# Patient Record
Sex: Male | Born: 1935 | Race: White | Hispanic: No | Smoking: Former smoker
Health system: Southern US, Community
[De-identification: ages and names within clinical notes are randomized; demographics above are authoritative.]

## PROBLEM LIST (undated history)

## (undated) DIAGNOSIS — K219 Gastro-esophageal reflux disease without esophagitis: Secondary | ICD-10-CM

## (undated) DIAGNOSIS — N400 Enlarged prostate without lower urinary tract symptoms: Secondary | ICD-10-CM

## (undated) DIAGNOSIS — M109 Gout, unspecified: Secondary | ICD-10-CM

## (undated) DIAGNOSIS — M519 Unspecified thoracic, thoracolumbar and lumbosacral intervertebral disc disorder: Secondary | ICD-10-CM

## (undated) DIAGNOSIS — I10 Essential (primary) hypertension: Secondary | ICD-10-CM

## (undated) DIAGNOSIS — R195 Other fecal abnormalities: Secondary | ICD-10-CM

## (undated) DIAGNOSIS — E785 Hyperlipidemia, unspecified: Secondary | ICD-10-CM

## (undated) DIAGNOSIS — M1990 Unspecified osteoarthritis, unspecified site: Secondary | ICD-10-CM

## (undated) HISTORY — PX: EYE SURGERY: SHX253

## (undated) HISTORY — DX: Other fecal abnormalities: R19.5

## (undated) HISTORY — DX: Benign prostatic hyperplasia without lower urinary tract symptoms: N40.0

## (undated) HISTORY — DX: Gout, unspecified: M10.9

## (undated) HISTORY — PX: COLONOSCOPY: SHX174

## (undated) HISTORY — PX: TONSILLECTOMY: SUR1361

## (undated) HISTORY — PX: OTHER SURGICAL HISTORY: SHX169

---

## 2003-11-22 ENCOUNTER — Ambulatory Visit (HOSPITAL_COMMUNITY): Admission: RE | Admit: 2003-11-22 | Discharge: 2003-11-22 | Payer: Self-pay | Admitting: Internal Medicine

## 2011-12-30 ENCOUNTER — Encounter (INDEPENDENT_AMBULATORY_CARE_PROVIDER_SITE_OTHER): Payer: Self-pay | Admitting: *Deleted

## 2012-01-08 ENCOUNTER — Telehealth (INDEPENDENT_AMBULATORY_CARE_PROVIDER_SITE_OTHER): Payer: Self-pay | Admitting: *Deleted

## 2012-01-08 ENCOUNTER — Encounter (INDEPENDENT_AMBULATORY_CARE_PROVIDER_SITE_OTHER): Payer: Self-pay | Admitting: *Deleted

## 2012-01-08 ENCOUNTER — Other Ambulatory Visit (INDEPENDENT_AMBULATORY_CARE_PROVIDER_SITE_OTHER): Payer: Self-pay | Admitting: *Deleted

## 2012-01-08 DIAGNOSIS — Z1211 Encounter for screening for malignant neoplasm of colon: Secondary | ICD-10-CM

## 2012-01-08 DIAGNOSIS — Z86010 Personal history of colonic polyps: Secondary | ICD-10-CM

## 2012-01-08 MED ORDER — PEG-KCL-NACL-NASULF-NA ASC-C 100 G PO SOLR: 1.0000 | Freq: Once | ORAL | Status: DC

## 2012-01-08 NOTE — Telephone Encounter (Signed)
Patient needs movi prep 

## 2012-02-11 ENCOUNTER — Telehealth (INDEPENDENT_AMBULATORY_CARE_PROVIDER_SITE_OTHER): Payer: Self-pay | Admitting: *Deleted

## 2012-02-11 NOTE — Telephone Encounter (Signed)
  Procedure: tcs  Reason/Indication:  Hx polyps  Has patient had this procedure before?  yes  If so, when, by whom and where?  2005  Is there a family history of colon cancer?  no  Who?  What age when diagnosed?    Is patient diabetic?   no      Does patient have prosthetic heart valve?  no  Do you have a pacemaker?  no  Has patient had joint replacement within last 12 months?  no  Is patient on Coumadin, Plavix and/or Aspirin? yes  Medications: ASA 81 mg daily, vit 2 2000 mg daily, simvastatin 20 mg daily, lisinopril 40 mg daily, oxycodone 325 mg 2-4 tab daily, magnesium 500 mg daily, vit b12 injection 1000 mg monthly  Allergies: nkda  Medication Adjustment: asa 2 days  Procedure date & time: 03/04/12 at 1200

## 2012-02-12 NOTE — Telephone Encounter (Signed)
agree

## 2012-02-18 ENCOUNTER — Encounter (HOSPITAL_COMMUNITY): Payer: Self-pay | Admitting: Pharmacy Technician

## 2012-03-03 MED ORDER — SODIUM CHLORIDE 0.45 % IV SOLN
INTRAVENOUS | Status: DC
  Administered 2012-03-04: 1000 mL via INTRAVENOUS

## 2012-03-04 ENCOUNTER — Encounter (HOSPITAL_COMMUNITY)
Admission: RE | Disposition: A | Discharge status: Home / Self Care | Payer: Self-pay | Source: Ambulatory Visit | Attending: Internal Medicine

## 2012-03-04 ENCOUNTER — Ambulatory Visit (HOSPITAL_COMMUNITY)
Admission: RE | Admit: 2012-03-04 | Discharge: 2012-03-04 | Disposition: A | Discharge status: Home / Self Care | Payer: Medicare Other | Source: Ambulatory Visit | Attending: Internal Medicine | Admitting: Internal Medicine

## 2012-03-04 ENCOUNTER — Encounter (HOSPITAL_COMMUNITY): Payer: Self-pay | Admitting: *Deleted

## 2012-03-04 DIAGNOSIS — Z1211 Encounter for screening for malignant neoplasm of colon: Secondary | ICD-10-CM | POA: Diagnosis present

## 2012-03-04 DIAGNOSIS — D126 Benign neoplasm of colon, unspecified: Secondary | ICD-10-CM | POA: Diagnosis not present

## 2012-03-04 DIAGNOSIS — K644 Residual hemorrhoidal skin tags: Secondary | ICD-10-CM | POA: Insufficient documentation

## 2012-03-04 DIAGNOSIS — Z86010 Personal history of colonic polyps: Secondary | ICD-10-CM

## 2012-03-04 DIAGNOSIS — K5730 Diverticulosis of large intestine without perforation or abscess without bleeding: Secondary | ICD-10-CM

## 2012-03-04 DIAGNOSIS — I10 Essential (primary) hypertension: Secondary | ICD-10-CM | POA: Insufficient documentation

## 2012-03-04 HISTORY — DX: Unspecified thoracic, thoracolumbar and lumbosacral intervertebral disc disorder: M51.9

## 2012-03-04 HISTORY — DX: Hyperlipidemia, unspecified: E78.5

## 2012-03-04 HISTORY — PX: COLONOSCOPY: SHX5424

## 2012-03-04 HISTORY — DX: Unspecified osteoarthritis, unspecified site: M19.90

## 2012-03-04 HISTORY — DX: Essential (primary) hypertension: I10

## 2012-03-04 SURGERY — COLONOSCOPY
Anesthesia: Moderate Sedation

## 2012-03-04 MED ORDER — MIDAZOLAM HCL 5 MG/5ML IJ SOLN
INTRAMUSCULAR | Status: AC
  Filled 2012-03-04: qty 10

## 2012-03-04 MED ORDER — MEPERIDINE HCL 50 MG/ML IJ SOLN
INTRAMUSCULAR | Status: AC
  Filled 2012-03-04: qty 1

## 2012-03-04 MED ORDER — MIDAZOLAM HCL 5 MG/5ML IJ SOLN
INTRAMUSCULAR | Status: DC | PRN
  Administered 2012-03-04 (×4): 2 mg via INTRAVENOUS

## 2012-03-04 MED ORDER — STERILE WATER FOR IRRIGATION IR SOLN
Status: DC | PRN
  Administered 2012-03-04: 11:00:00

## 2012-03-04 MED ORDER — MEPERIDINE HCL 50 MG/ML IJ SOLN
INTRAMUSCULAR | Status: DC | PRN
  Administered 2012-03-04 (×2): 25 mg via INTRAVENOUS

## 2012-03-04 NOTE — H&P (Signed)
Jon Sexton is an 76 y.o. male.   Chief Complaint: Patient is here for colonoscopy. HPI: Patient is 76 year old Caucasian male who is in for screening colonoscopy. His last exam was 10 years ago with removal of small polyp. He was therefore placed wait 10 years before his next exam. He denies abdominal pain change in his bowel habits or rectal bleeding. Family history is negative for colorectal carcinoma.  Past Medical History  Diagnosis Date  . Hypertension   . Hyperlipemia   . Disc disorder     bulging disc  . Arthritis     Past Surgical History  Procedure Date  . Tonsillectomy   . Hip replacement right   . Colonoscopy   . Eye surgery     bilateral cataract    History reviewed. No pertinent family history. Social History:  reports that he has been smoking Cigarettes.  He has a 10 pack-year smoking history. He does not have any smokeless tobacco history on file. He reports that he does not drink alcohol or use illicit drugs.  Allergies: No Known Allergies  Medications Prior to Admission  Medication Sig Dispense Refill  . ALPRAZolam (XANAX) 1 MG tablet Take 1 mg by mouth at bedtime as needed. Sleep      . aspirin EC 81 MG tablet Take 81 mg by mouth daily.      . cholecalciferol (VITAMIN D) 1000 UNITS tablet Take 1,000 Units by mouth daily.      . cyanocobalamin (,VITAMIN B-12,) 1000 MCG/ML injection Inject 1,000 mcg into the muscle every 30 (thirty) days.      Marland Kitchen lisinopril (PRINIVIL,ZESTRIL) 40 MG tablet Take 40 mg by mouth daily.      . magnesium gluconate (MAGONATE) 500 MG tablet Take 500 mg by mouth daily.      Marland Kitchen oxyCODONE-acetaminophen (PERCOCET/ROXICET) 5-325 MG per tablet Take 1 tablet by mouth every 4 (four) hours as needed. Pain      . peg 3350 powder (MOVIPREP) 100 G SOLR Take 1 kit (100 g total) by mouth once.  1 kit  0  . simvastatin (ZOCOR) 20 MG tablet Take 20 mg by mouth every evening.        No results found for this or any previous visit (from the past 48  hour(s)). No results found.  ROS  Blood pressure 149/100, pulse 94, temperature 98.5 F (36.9 C), temperature source Oral, resp. rate 17, height 5' 7.5" (1.715 m), weight 181 lb (82.101 kg), SpO2 94.00%. Physical Exam  Constitutional: He appears well-developed and well-nourished.  HENT:  Mouth/Throat: Oropharynx is clear and moist.  Eyes: Conjunctivae normal are normal. No scleral icterus.  Neck: No thyromegaly present.  Cardiovascular: Normal rate, regular rhythm and normal heart sounds.   No murmur heard. Respiratory: Effort normal and breath sounds normal.  GI: Soft. He exhibits no distension and no mass. There is no tenderness.  Musculoskeletal: He exhibits no edema.  Lymphadenopathy:    He has no cervical adenopathy.  Neurological: He is alert.  Skin: Skin is warm and dry.     Assessment/Plan Average risk screening colonoscopy.  REHMAN,NAJEEB U 03/04/2012, 10:58 AM

## 2012-03-04 NOTE — Op Note (Signed)
COLONOSCOPY PROCEDURE REPORT  PATIENT:  Jon Sexton  MR#:  [id number] Birthdate:  1935-09-29, 76 y.o., male Endoscopist:  Dr. Malissa Hippo, MD Referred By:  Dr. Lorelei Pont, DO Procedure Date: 03/04/2012  Procedure:   Colonoscopy  Indications:  Patient is 76 year old Caucasian male was undergoing average risk screening colonoscopy. His last examination was 10 years ago with removal of small polyp in he was advised to come back in 10 years. He denies rectal bleeding change in his bowel habits or abdominal pain. Family history is negative for colorectal carcinoma.  Informed Consent:  The procedure and risks were reviewed with the patient and informed consent was obtained.  Medications:  Demerol 50 mg IV Versed 8 mg IV  Description of procedure:  After a digital rectal exam was performed, that colonoscope was advanced from the anus through the rectum and colon to the area of the cecum, ileocecal valve and appendiceal orifice. The cecum was deeply intubated. These structures were well-seen and photographed for the record. From the level of the cecum and ileocecal valve, the scope was slowly and cautiously withdrawn. The mucosal surfaces were carefully surveyed utilizing scope tip to flexion to facilitate fold flattening as needed. The scope was pulled down into the rectum where a thorough exam including retroflexion was performed.  Findings:   Prep satisfactory. He had a lot of liquid stool throughout the colon most of which was suctioned out. Small polyp ablated via cold biopsy from mid transverse colon. Scattered diverticula at sigmoid and descending colon. Normal rectal mucosa. Small hemorrhoids below the dentate line.  Therapeutic/Diagnostic Maneuvers Performed:  See above  Complications:  See above  Cecal Withdrawal Time:  16 minutes  Impression:  Examination performed to cecum. Examination somewhat compromised because of quality of prep. Small polyp ablated via cold biopsy  from transverse colon. Left-sided diverticulosis. Small external hemorrhoids.  Recommendations:  Standard instructions given. I will contact patient with biopsy results and further recommendations.  Jon Sexton,Jon Sexton  03/04/2012 11:36 AM  CC: Dr. Lorelei Pont, DO & Dr. No ref. provider found

## 2012-03-06 ENCOUNTER — Encounter (HOSPITAL_COMMUNITY): Payer: Self-pay | Admitting: Internal Medicine

## 2012-03-09 ENCOUNTER — Encounter (INDEPENDENT_AMBULATORY_CARE_PROVIDER_SITE_OTHER): Payer: Self-pay | Admitting: *Deleted

## 2016-06-16 NOTE — H&P (Signed)
TOTAL HIP ADMISSION H&P  Patient is admitted for left total hip arthroplasty, anterior approach.  Subjective:  Chief Complaint:    Left hip primary OA / pain  HPI: Jon Sexton, 81 y.o. male, has a history of pain and functional disability in the left hip(s) due to arthritis and patient has failed non-surgical conservative treatments for greater than 12 weeks to include NSAID's and/or analgesics, corticosteriod injections, use of assistive devices and activity modification.  Onset of symptoms was gradual starting 1+ years ago with gradually worsening course since that time.The patient noted prior procedures of the hip to include arthroplasty on the right hip(s).  Patient currently rates pain in the left hip at 8 out of 10 with activity. Patient has worsening of pain with activity and weight bearing, trendelenberg gait, pain that interfers with activities of daily living and pain with passive range of motion. Patient has evidence of periarticular osteophytes and joint space narrowing by imaging studies. This condition presents safety issues increasing the risk of falls.  There is no current active infection.   Risks, benefits and expectations were discussed with the patient.  Risks including but not limited to the risk of anesthesia, blood clots, nerve damage, blood vessel damage, failure of the prosthesis, infection and up to and including death.  Patient understand the risks, benefits and expectations and wishes to proceed with surgery.   PCP: Emelda Fear, DO  D/C Plans:       Home  Post-op Meds:       No Rx given   Tranexamic Acid:      To be given - IV   Decadron:      Is to be given  FYI:     ASA  Norco    Past Medical History:  Diagnosis Date  . Arthritis   . Disc disorder    bulging disc  . Hyperlipemia   . Hypertension     Past Surgical History:  Procedure Laterality Date  . COLONOSCOPY    . COLONOSCOPY  03/04/2012   Procedure: COLONOSCOPY;  Surgeon: Rogene Houston, MD;   Location: [facility];  Service: Endoscopy;  Laterality: N/A;  1200  . EYE SURGERY     bilateral cataract  . hip replacement right    . TONSILLECTOMY      No prescriptions prior to admission.   No Known Allergies   Social History  Substance Use Topics  . Smoking status: Current Every Day Smoker    Packs/day: 0.50    Years: 20.00    Types: Cigarettes  . Smokeless tobacco: Not on file  . Alcohol use No       Review of Systems  Constitutional: Negative.   HENT: Negative.   Eyes: Negative.   Respiratory: Negative.   Cardiovascular: Negative.   Gastrointestinal: Positive for constipation.  Genitourinary: Negative.   Musculoskeletal: Positive for back pain and joint pain.  Skin: Negative.   Neurological: Negative.   Endo/Heme/Allergies: Negative.   Psychiatric/Behavioral: Negative.     Objective:  Physical Exam  Constitutional: He is oriented to person, place, and time. He appears well-developed.  HENT:  Head: Normocephalic.  Eyes: Pupils are equal, round, and reactive to light.  Neck: Neck supple. No JVD present. No tracheal deviation present. No thyromegaly present.  Cardiovascular: Normal rate, regular rhythm and intact distal pulses.   Respiratory: Effort normal and breath sounds normal. No respiratory distress. He has no wheezes.  GI: Soft. There is no tenderness. There is no guarding.  Musculoskeletal:       Left hip: He exhibits decreased range of motion, decreased strength, tenderness and bony tenderness. He exhibits no swelling, no deformity and no laceration.  Lymphadenopathy:    He has no cervical adenopathy.  Neurological: He is alert and oriented to person, place, and time. A sensory deficit (bilateral LE tingling) is present.  Skin: Skin is warm and dry.  Psychiatric: He has a normal mood and affect.       Labs:  Estimated body mass index is 27.93 kg/m as calculated from the following:   Height as of 03/04/12: 5' 7.5" (1.715 m).   Weight as  of 03/04/12: 82.1 kg (181 lb).   Imaging Review Plain radiographs demonstrate severe degenerative joint disease of the left hip(s). The bone quality appears to be good for age and reported activity level.  Assessment/Plan:  End stage arthritis, left hip(s)  The patient history, physical examination, clinical judgement of the provider and imaging studies are consistent with end stage degenerative joint disease of the left hip(s) and total hip arthroplasty is deemed medically necessary. The treatment options including medical management, injection therapy, arthroscopy and arthroplasty were discussed at length. The risks and benefits of total hip arthroplasty were presented and reviewed. The risks due to aseptic loosening, infection, stiffness, dislocation/subluxation,  thromboembolic complications and other imponderables were discussed.  The patient acknowledged the explanation, agreed to proceed with the plan and consent was signed. Patient is being admitted for inpatient treatment for surgery, pain control, PT, OT, prophylactic antibiotics, VTE prophylaxis, progressive ambulation and ADL's and discharge planning.The patient is planning to be discharged home.     [name] [name]   PA-C  06/16/2016, 2:42 PM

## 2016-06-19 NOTE — Patient Instructions (Addendum)
Jon Sexton  06/19/2016   Your procedure is scheduled on: 07/02/16  Report to [facility] Main  Entrance to admitting  At     0630 AM.  Call this number if you have problems the morning of surgery [phone]   Remember: ONLY 1 PERSON MAY GO WITH YOU TO SHORT STAY TO GET  READY MORNING OF [place].  Do not eat food or drink liquids :After Midnight.     Take these medicines the morning of surgery with A SIP OF WATER: allopurinol, oxycodone if needed, flomax, inhaler if needed and bring                                You may not have any metal on your body including hair pins and              piercings  Do not wear jewelry,lotions, powders or perfumes, deodorant                         Men may shave face and neck.   Do not bring valuables to the hospital. Jon Sexton.  Contacts, dentures or bridgework may not be worn into surgery.  Leave suitcase in the car. After surgery it may be brought to your room.               Please read over the following fact sheets you were given: _____________________________________________________________________             [facility] - Preparing for Surgery Before surgery, you can play an important role.  Because skin is not sterile, your skin needs to be as free of germs as possible.  You can reduce the number of germs on your skin by washing with CHG (chlorahexidine gluconate) soap before surgery.  CHG is an antiseptic cleaner which kills germs and bonds with the skin to continue killing germs even after washing. Please DO NOT use if you have an allergy to CHG or antibacterial soaps.  If your skin becomes reddened/irritated stop using the CHG and inform your nurse when you arrive at Short Stay. Do not shave (including legs and underarms) for at least 48 hours prior to the first CHG shower.  You may shave your face/neck. Please follow these instructions carefully:  1.   Shower with CHG Soap the night before surgery and the  morning of Surgery.  2.  If you choose to wash your hair, wash your hair first as usual with your  normal  shampoo.  3.  After you shampoo, rinse your hair and body thoroughly to remove the  shampoo.                           4.  Use CHG as you would any other liquid soap.  You can apply chg directly  to the skin and wash                       Gently with a scrungie or clean washcloth.  5.  Apply the CHG Soap to your body ONLY FROM THE NECK DOWN.   Do not use on face/ open  Wound or open sores. Avoid contact with eyes, ears mouth and genitals (private parts).                       Wash face,  Genitals (private parts) with your normal soap.             6.  Wash thoroughly, paying special attention to the area where your surgery  will be performed.  7.  Thoroughly rinse your body with warm water from the neck down.  8.  DO NOT shower/wash with your normal soap after using and rinsing off  the CHG Soap.                9.  Pat yourself dry with a clean towel.            10.  Wear clean pajamas.            11.  Place clean sheets on your bed the night of your first shower and do not  sleep with pets. Day of Surgery : Do not apply any lotions/deodorants the morning of surgery.  Please wear clean clothes to the hospital/surgery center.  FAILURE TO FOLLOW THESE INSTRUCTIONS MAY RESULT IN THE CANCELLATION OF YOUR SURGERY PATIENT SIGNATURE_________________________________  NURSE SIGNATURE__________________________________  ________________________________________________________________________  WHAT IS A BLOOD TRANSFUSION? Blood Transfusion Information  A transfusion is the replacement of blood or some of its parts. Blood is made up of multiple cells which provide different functions.  Red blood cells carry oxygen and are used for blood loss replacement.  White blood cells fight against infection.  Platelets control  bleeding.  Plasma helps clot blood.  Other blood products are available for specialized needs, such as hemophilia or other clotting disorders. BEFORE THE TRANSFUSION  Who gives blood for transfusions?   Healthy volunteers who are fully evaluated to make sure their blood is safe. This is blood bank blood. Transfusion therapy is the safest it has ever been in the practice of medicine. Before blood is taken from a donor, a complete history is taken to make sure that person has no history of diseases nor engages in risky social behavior (examples are intravenous drug use or sexual activity with multiple partners). The donor's travel history is screened to minimize risk of transmitting infections, such as malaria. The donated blood is tested for signs of infectious diseases, such as HIV and hepatitis. The blood is then tested to be sure it is compatible with you in order to minimize the chance of a transfusion reaction. If you or a relative donates blood, this is often done in anticipation of surgery and is not appropriate for emergency situations. It takes many days to process the donated blood. RISKS AND COMPLICATIONS Although transfusion therapy is very safe and saves many lives, the main dangers of transfusion include:   Getting an infectious disease.  Developing a transfusion reaction. This is an allergic reaction to something in the blood you were given. Every precaution is taken to prevent this. The decision to have a blood transfusion has been considered carefully by your caregiver before blood is given. Blood is not given unless the benefits outweigh the risks. AFTER THE TRANSFUSION  Right after receiving a blood transfusion, you will usually feel much better and more energetic. This is especially true if your red blood cells have gotten low (anemic). The transfusion raises the level of the red blood cells which carry oxygen, and this usually causes an energy increase.  The  nurse  administering the transfusion will monitor you carefully for complications. HOME CARE INSTRUCTIONS  No special instructions are needed after a transfusion. You may find your energy is better. Speak with your caregiver about any limitations on activity for underlying diseases you may have. SEEK MEDICAL CARE IF:   Your condition is not improving after your transfusion.  You develop redness or irritation at the intravenous (IV) site. SEEK IMMEDIATE MEDICAL CARE IF:  Any of the following symptoms occur over the next 12 hours:  Shaking chills.  You have a temperature by mouth above 102 F (38.9 C), not controlled by medicine.  Chest, back, or muscle pain.  People around you feel you are not acting correctly or are confused.  Shortness of breath or difficulty breathing.  Dizziness and fainting.  You get a rash or develop hives.  You have a decrease in urine output.  Your urine turns a dark color or changes to pink, red, or brown. Any of the following symptoms occur over the next 10 days:  You have a temperature by mouth above 102 F (38.9 C), not controlled by medicine.  Shortness of breath.  Weakness after normal activity.  The white part of the eye turns yellow (jaundice).  You have a decrease in the amount of urine or are urinating less often.  Your urine turns a dark color or changes to pink, red, or brown. Document Released: 03/08/2000 Document Revised: 06/03/2011 Document Reviewed: 10/26/2007 Exit[employer] Patient Information 2014 [place].  _______________________________________________________________________  Incentive Spirometer  An incentive spirometer is a tool that can help keep your lungs clear and active. This tool measures how well you are filling your lungs with each breath. Taking long deep breaths may help reverse or decrease the chance of developing breathing (pulmonary) problems (especially infection) following:  A long period of time when you are  unable to move or be active. BEFORE THE PROCEDURE   If the spirometer includes an indicator to show your best effort, your nurse or respiratory therapist will set it to a desired goal.  If possible, sit up straight or lean slightly forward. Try not to slouch.  Hold the incentive spirometer in an upright position. INSTRUCTIONS FOR USE  1. Sit on the edge of your bed if possible, or sit up as far as you can in bed or on a chair. 2. Hold the incentive spirometer in an upright position. 3. Breathe out normally. 4. Place the mouthpiece in your mouth and seal your lips tightly around it. 5. Breathe in slowly and as deeply as possible, raising the piston or the ball toward the top of the column. 6. Hold your breath for 3-5 seconds or for as long as possible. Allow the piston or ball to fall to the bottom of the column. 7. Remove the mouthpiece from your mouth and breathe out normally. 8. Rest for a few seconds and repeat Steps 1 through 7 at least 10 times every 1-2 hours when you are awake. Take your time and take a few normal breaths between deep breaths. 9. The spirometer may include an indicator to show your best effort. Use the indicator as a goal to work toward during each repetition. 10. After each set of 10 deep breaths, practice coughing to be sure your lungs are clear. If you have an incision (the cut made at the time of surgery), support your incision when coughing by placing a pillow or rolled up towels firmly against it. Once you are able to get  out of bed, walk around indoors and cough well. You may stop using the incentive spirometer when instructed by your caregiver.  RISKS AND COMPLICATIONS  Take your time so you do not get dizzy or light-headed.  If you are in pain, you may need to take or ask for pain medication before doing incentive spirometry. It is harder to take a deep breath if you are having pain. AFTER USE  Rest and breathe slowly and easily.  It can be helpful to  keep track of a log of your progress. Your caregiver can provide you with a simple table to help with this. If you are using the spirometer at home, follow these instructions: [place] IF:   You are having difficultly using the spirometer.  You have trouble using the spirometer as often as instructed.  Your pain medication is not giving enough relief while using the spirometer.  You develop fever of 100.5 F (38.1 C) or higher. SEEK IMMEDIATE MEDICAL CARE IF:   You cough up bloody sputum that had not been present before.  You develop fever of 102 F (38.9 C) or greater.  You develop worsening pain at or near the incision site. MAKE SURE YOU:   Understand these instructions.  Will watch your condition.  Will get help right away if you are not doing well or get worse. Document Released: 07/22/2006 Document Revised: 06/03/2011 Document Reviewed: 09/22/2006 [facility] Patient Information 2014 [place], [place].   ________________________________________________________________________

## 2016-06-26 ENCOUNTER — Encounter (HOSPITAL_COMMUNITY): Payer: Self-pay

## 2016-06-26 ENCOUNTER — Encounter (HOSPITAL_COMMUNITY)
Admission: RE | Admit: 2016-06-26 | Discharge: 2016-06-26 | Disposition: A | Discharge status: Home / Self Care | Payer: Medicare Other | Source: Ambulatory Visit | Attending: Orthopedic Surgery | Admitting: Orthopedic Surgery

## 2016-06-26 DIAGNOSIS — Z01812 Encounter for preprocedural laboratory examination: Secondary | ICD-10-CM | POA: Insufficient documentation

## 2016-06-26 DIAGNOSIS — Z01810 Encounter for preprocedural cardiovascular examination: Secondary | ICD-10-CM | POA: Insufficient documentation

## 2016-06-26 HISTORY — DX: Gastro-esophageal reflux disease without esophagitis: K21.9

## 2016-06-26 LAB — ABO/RH: ABO/RH(D): O POS

## 2016-06-26 LAB — CBC
HCT: 31.2 % — ABNORMAL LOW (ref 39.0–52.0)
Hemoglobin: 10.5 g/dL — ABNORMAL LOW (ref 13.0–17.0)
MCH: 28.9 pg (ref 26.0–34.0)
MCHC: 33.7 g/dL (ref 30.0–36.0)
MCV: 86 fL (ref 78.0–100.0)
PLATELETS: 129 10*3/uL — AB (ref 150–400)
RBC: 3.63 MIL/uL — ABNORMAL LOW (ref 4.22–5.81)
RDW: 16.1 % — ABNORMAL HIGH (ref 11.5–15.5)
WBC: 5.1 10*3/uL (ref 4.0–10.5)

## 2016-06-26 LAB — BASIC METABOLIC PANEL
ANION GAP: 10 (ref 5–15)
BUN: 24 mg/dL — ABNORMAL HIGH (ref 6–20)
CALCIUM: 9.1 mg/dL (ref 8.9–10.3)
CO2: 25 mmol/L (ref 22–32)
CREATININE: 1.09 mg/dL (ref 0.61–1.24)
Chloride: 97 mmol/L — ABNORMAL LOW (ref 101–111)
GFR calc Af Amer: >60 mL/min (ref >60)
GLUCOSE: 121 mg/dL — AB (ref 65–99)
Potassium: 4.1 mmol/L (ref 3.5–5.1)
Sodium: 132 mmol/L — ABNORMAL LOW (ref 135–145)

## 2016-06-26 LAB — SURGICAL PCR SCREEN
MRSA, PCR: NEGATIVE
STAPHYLOCOCCUS AUREUS: NEGATIVE

## 2016-06-26 NOTE — Progress Notes (Signed)
CMP and CBC done 06/26/16 routed to Dr. Alvan Dame via epic

## 2016-07-02 ENCOUNTER — Inpatient Hospital Stay (HOSPITAL_COMMUNITY): Payer: Medicare Other | Admitting: Certified Registered Nurse Anesthetist

## 2016-07-02 ENCOUNTER — Inpatient Hospital Stay (HOSPITAL_COMMUNITY): Payer: Medicare Other

## 2016-07-02 ENCOUNTER — Encounter (HOSPITAL_COMMUNITY)
Admission: RE | Disposition: A | Discharge status: Home / Self Care | Payer: Self-pay | Source: Ambulatory Visit | Attending: Orthopedic Surgery

## 2016-07-02 ENCOUNTER — Encounter (HOSPITAL_COMMUNITY): Payer: Self-pay | Admitting: Certified Registered Nurse Anesthetist

## 2016-07-02 ENCOUNTER — Inpatient Hospital Stay (HOSPITAL_COMMUNITY)
Admission: RE | Admit: 2016-07-02 | Discharge: 2016-07-03 | DRG: 470 | Disposition: A | Discharge status: Home / Self Care | Payer: Medicare Other | Source: Ambulatory Visit | Attending: Orthopedic Surgery | Admitting: Orthopedic Surgery

## 2016-07-02 DIAGNOSIS — Z6829 Body mass index (BMI) 29.0-29.9, adult: Secondary | ICD-10-CM | POA: Diagnosis not present

## 2016-07-02 DIAGNOSIS — K219 Gastro-esophageal reflux disease without esophagitis: Secondary | ICD-10-CM | POA: Diagnosis present

## 2016-07-02 DIAGNOSIS — I10 Essential (primary) hypertension: Secondary | ICD-10-CM | POA: Diagnosis present

## 2016-07-02 DIAGNOSIS — K5900 Constipation, unspecified: Secondary | ICD-10-CM | POA: Diagnosis present

## 2016-07-02 DIAGNOSIS — E663 Overweight: Secondary | ICD-10-CM | POA: Diagnosis present

## 2016-07-02 DIAGNOSIS — E785 Hyperlipidemia, unspecified: Secondary | ICD-10-CM | POA: Diagnosis present

## 2016-07-02 DIAGNOSIS — F17210 Nicotine dependence, cigarettes, uncomplicated: Secondary | ICD-10-CM | POA: Diagnosis present

## 2016-07-02 DIAGNOSIS — Z96642 Presence of left artificial hip joint: Secondary | ICD-10-CM

## 2016-07-02 DIAGNOSIS — M1612 Unilateral primary osteoarthritis, left hip: Secondary | ICD-10-CM | POA: Diagnosis present

## 2016-07-02 DIAGNOSIS — Z96649 Presence of unspecified artificial hip joint: Secondary | ICD-10-CM

## 2016-07-02 HISTORY — PX: TOTAL HIP ARTHROPLASTY: SHX124

## 2016-07-02 LAB — TYPE AND SCREEN
ABO/RH(D): O POS
Antibody Screen: NEGATIVE

## 2016-07-02 SURGERY — ARTHROPLASTY, HIP, TOTAL, ANTERIOR APPROACH
Anesthesia: General | Site: Hip | Laterality: Left

## 2016-07-02 MED ORDER — ALBUTEROL SULFATE (2.5 MG/3ML) 0.083% IN NEBU: 2.5000 mg | INHALATION_SOLUTION | RESPIRATORY_TRACT | Status: DC | PRN

## 2016-07-02 MED ORDER — GLYCOPYRROLATE 0.2 MG/ML IV SOSY
PREFILLED_SYRINGE | INTRAVENOUS | Status: AC
  Filled 2016-07-02: qty 5

## 2016-07-02 MED ORDER — CHLORHEXIDINE GLUCONATE 4 % EX LIQD: 60.0000 mL | Freq: Once | CUTANEOUS | Status: DC

## 2016-07-02 MED ORDER — ONDANSETRON HCL 4 MG/2ML IJ SOLN: 4.0000 mg | Freq: Four times a day (QID) | INTRAMUSCULAR | Status: DC | PRN

## 2016-07-02 MED ORDER — ONDANSETRON HCL 4 MG/2ML IJ SOLN
INTRAMUSCULAR | Status: DC | PRN
  Administered 2016-07-02: 4 mg via INTRAVENOUS

## 2016-07-02 MED ORDER — DEXAMETHASONE SODIUM PHOSPHATE 10 MG/ML IJ SOLN
10.0000 mg | Freq: Once | INTRAMUSCULAR | Status: AC
  Administered 2016-07-02: 10 mg via INTRAVENOUS

## 2016-07-02 MED ORDER — MAGNESIUM CITRATE PO SOLN: 1.0000 | Freq: Once | ORAL | Status: DC | PRN

## 2016-07-02 MED ORDER — PROPOFOL 10 MG/ML IV BOLUS
INTRAVENOUS | Status: DC | PRN
  Administered 2016-07-02: 150 mg via INTRAVENOUS

## 2016-07-02 MED ORDER — PHENOL 1.4 % MT LIQD: 1.0000 | OROMUCOSAL | Status: DC | PRN

## 2016-07-02 MED ORDER — HYDROMORPHONE HCL 2 MG/ML IJ SOLN: 0.5000 mg | INTRAMUSCULAR | Status: DC | PRN

## 2016-07-02 MED ORDER — METOCLOPRAMIDE HCL 5 MG/ML IJ SOLN: 5.0000 mg | Freq: Three times a day (TID) | INTRAMUSCULAR | Status: DC | PRN

## 2016-07-02 MED ORDER — ALPRAZOLAM 1 MG PO TABS
1.0000 mg | ORAL_TABLET | Freq: Every evening | ORAL | Status: DC | PRN
  Administered 2016-07-02: 1 mg via ORAL
  Filled 2016-07-02: qty 1

## 2016-07-02 MED ORDER — PROPOFOL 10 MG/ML IV BOLUS
INTRAVENOUS | Status: AC
  Filled 2016-07-02: qty 20

## 2016-07-02 MED ORDER — ALUM & MAG HYDROXIDE-SIMETH 200-200-20 MG/5ML PO SUSP: 15.0000 mL | ORAL | Status: DC | PRN

## 2016-07-02 MED ORDER — EPHEDRINE 5 MG/ML INJ
INTRAVENOUS | Status: AC
  Filled 2016-07-02: qty 10

## 2016-07-02 MED ORDER — OXYCODONE HCL 5 MG PO TABS: 5.0000 mg | ORAL_TABLET | Freq: Once | ORAL | Status: DC | PRN

## 2016-07-02 MED ORDER — HYDROMORPHONE HCL 1 MG/ML IJ SOLN
INTRAMUSCULAR | Status: DC | PRN
  Administered 2016-07-02: .4 mg via INTRAVENOUS
  Administered 2016-07-02: .2 mg via INTRAVENOUS
  Administered 2016-07-02: .4 mg via INTRAVENOUS

## 2016-07-02 MED ORDER — LIDOCAINE 2% (20 MG/ML) 5 ML SYRINGE
INTRAMUSCULAR | Status: DC | PRN
  Administered 2016-07-02: 100 mg via INTRAVENOUS

## 2016-07-02 MED ORDER — FENTANYL CITRATE (PF) 100 MCG/2ML IJ SOLN
INTRAMUSCULAR | Status: AC
  Filled 2016-07-02: qty 2

## 2016-07-02 MED ORDER — FENTANYL CITRATE (PF) 250 MCG/5ML IJ SOLN
INTRAMUSCULAR | Status: AC
  Filled 2016-07-02: qty 5

## 2016-07-02 MED ORDER — SODIUM CHLORIDE 0.9 % IJ SOLN
INTRAMUSCULAR | Status: AC
  Filled 2016-07-02: qty 10

## 2016-07-02 MED ORDER — POLYETHYLENE GLYCOL 3350 17 G PO PACK
17.0000 g | PACK | Freq: Two times a day (BID) | ORAL | Status: DC
  Administered 2016-07-02 – 2016-07-03 (×2): 17 g via ORAL
  Filled 2016-07-02 (×2): qty 1

## 2016-07-02 MED ORDER — BUPIVACAINE HCL (PF) 0.5 % IJ SOLN
INTRAMUSCULAR | Status: AC
  Filled 2016-07-02: qty 30

## 2016-07-02 MED ORDER — METOCLOPRAMIDE HCL 5 MG PO TABS: 5.0000 mg | ORAL_TABLET | Freq: Three times a day (TID) | ORAL | Status: DC | PRN

## 2016-07-02 MED ORDER — HYDROMORPHONE HCL 2 MG/ML IJ SOLN: 0.2500 mg | INTRAMUSCULAR | Status: DC | PRN

## 2016-07-02 MED ORDER — FERROUS SULFATE 325 (65 FE) MG PO TABS
325.0000 mg | ORAL_TABLET | Freq: Three times a day (TID) | ORAL | Status: DC
  Administered 2016-07-02 – 2016-07-03 (×2): 325 mg via ORAL
  Filled 2016-07-02 (×2): qty 1

## 2016-07-02 MED ORDER — EPHEDRINE SULFATE 50 MG/ML IJ SOLN
INTRAMUSCULAR | Status: DC | PRN
  Administered 2016-07-02 (×7): 10 mg via INTRAVENOUS

## 2016-07-02 MED ORDER — DIPHENHYDRAMINE HCL 25 MG PO CAPS: 25.0000 mg | ORAL_CAPSULE | Freq: Four times a day (QID) | ORAL | Status: DC | PRN

## 2016-07-02 MED ORDER — ASPIRIN 81 MG PO CHEW
81.0000 mg | CHEWABLE_TABLET | Freq: Two times a day (BID) | ORAL | Status: DC
  Administered 2016-07-02 – 2016-07-03 (×2): 81 mg via ORAL
  Filled 2016-07-02 (×2): qty 1

## 2016-07-02 MED ORDER — SODIUM CHLORIDE 0.9 % IV SOLN
1000.0000 mg | INTRAVENOUS | Status: AC
  Administered 2016-07-02: 1000 mg via INTRAVENOUS
  Filled 2016-07-02: qty 1100

## 2016-07-02 MED ORDER — ROCURONIUM BROMIDE 50 MG/5ML IV SOSY
PREFILLED_SYRINGE | INTRAVENOUS | Status: AC
  Filled 2016-07-02: qty 5

## 2016-07-02 MED ORDER — PROPOFOL 10 MG/ML IV BOLUS
INTRAVENOUS | Status: AC
  Filled 2016-07-02: qty 40

## 2016-07-02 MED ORDER — LACTATED RINGERS IV SOLN
INTRAVENOUS | Status: DC
  Administered 2016-07-02 (×2): via INTRAVENOUS

## 2016-07-02 MED ORDER — DEXAMETHASONE SODIUM PHOSPHATE 10 MG/ML IJ SOLN
INTRAMUSCULAR | Status: AC
  Filled 2016-07-02: qty 1

## 2016-07-02 MED ORDER — ALLOPURINOL 100 MG PO TABS
100.0000 mg | ORAL_TABLET | Freq: Every day | ORAL | Status: DC
  Administered 2016-07-02 – 2016-07-03 (×2): 100 mg via ORAL
  Filled 2016-07-02 (×2): qty 1

## 2016-07-02 MED ORDER — HYDROCODONE-ACETAMINOPHEN 7.5-325 MG PO TABS
1.0000 | ORAL_TABLET | ORAL | Status: DC
Start: 2016-07-02 — End: 2016-07-03
  Administered 2016-07-02 – 2016-07-03 (×3): 1 via ORAL
  Filled 2016-07-02 (×3): qty 1

## 2016-07-02 MED ORDER — METHOCARBAMOL 1000 MG/10ML IJ SOLN
500.0000 mg | Freq: Four times a day (QID) | INTRAVENOUS | Status: DC | PRN
  Administered 2016-07-02: 500 mg via INTRAVENOUS
  Filled 2016-07-02: qty 5
  Filled 2016-07-02: qty 550

## 2016-07-02 MED ORDER — SODIUM CHLORIDE 0.9 % IR SOLN
Status: DC | PRN
  Administered 2016-07-02: 1000 mL

## 2016-07-02 MED ORDER — OXYCODONE HCL 5 MG/5ML PO SOLN
5.0000 mg | Freq: Once | ORAL | Status: DC | PRN
  Filled 2016-07-02: qty 5

## 2016-07-02 MED ORDER — METHOCARBAMOL 500 MG PO TABS: 500.0000 mg | ORAL_TABLET | Freq: Four times a day (QID) | ORAL | Status: DC | PRN

## 2016-07-02 MED ORDER — BISACODYL 10 MG RE SUPP: 10.0000 mg | Freq: Every day | RECTAL | Status: DC | PRN

## 2016-07-02 MED ORDER — CELECOXIB 200 MG PO CAPS
200.0000 mg | ORAL_CAPSULE | Freq: Two times a day (BID) | ORAL | Status: DC
  Administered 2016-07-02 – 2016-07-03 (×2): 200 mg via ORAL
  Filled 2016-07-02 (×2): qty 1

## 2016-07-02 MED ORDER — FENTANYL CITRATE (PF) 100 MCG/2ML IJ SOLN
INTRAMUSCULAR | Status: DC | PRN
  Administered 2016-07-02 (×5): 50 ug via INTRAVENOUS

## 2016-07-02 MED ORDER — SUCCINYLCHOLINE CHLORIDE 200 MG/10ML IV SOSY
PREFILLED_SYRINGE | INTRAVENOUS | Status: AC
  Filled 2016-07-02: qty 10

## 2016-07-02 MED ORDER — MENTHOL 3 MG MT LOZG: 1.0000 | LOZENGE | OROMUCOSAL | Status: DC | PRN

## 2016-07-02 MED ORDER — SUGAMMADEX SODIUM 200 MG/2ML IV SOLN
INTRAVENOUS | Status: DC | PRN
  Administered 2016-07-02: 200 mg via INTRAVENOUS

## 2016-07-02 MED ORDER — DEXAMETHASONE SODIUM PHOSPHATE 10 MG/ML IJ SOLN
10.0000 mg | Freq: Once | INTRAMUSCULAR | Status: AC
  Administered 2016-07-03: 10 mg via INTRAVENOUS
  Filled 2016-07-02: qty 1

## 2016-07-02 MED ORDER — TAMSULOSIN HCL 0.4 MG PO CAPS
0.4000 mg | ORAL_CAPSULE | Freq: Every day | ORAL | Status: DC
  Administered 2016-07-02 – 2016-07-03 (×2): 0.4 mg via ORAL
  Filled 2016-07-02 (×2): qty 1

## 2016-07-02 MED ORDER — ROCURONIUM BROMIDE 50 MG/5ML IV SOSY
PREFILLED_SYRINGE | INTRAVENOUS | Status: DC | PRN
  Administered 2016-07-02: 40 mg via INTRAVENOUS

## 2016-07-02 MED ORDER — CEFAZOLIN SODIUM-DEXTROSE 2-4 GM/100ML-% IV SOLN
INTRAVENOUS | Status: AC
  Filled 2016-07-02: qty 100

## 2016-07-02 MED ORDER — SUGAMMADEX SODIUM 200 MG/2ML IV SOLN
INTRAVENOUS | Status: AC
  Filled 2016-07-02: qty 2

## 2016-07-02 MED ORDER — GLYCOPYRROLATE 0.2 MG/ML IJ SOLN
INTRAMUSCULAR | Status: DC | PRN
  Administered 2016-07-02: 0.2 mg via INTRAVENOUS

## 2016-07-02 MED ORDER — HYDROMORPHONE HCL 2 MG/ML IJ SOLN
INTRAMUSCULAR | Status: AC
  Filled 2016-07-02: qty 1

## 2016-07-02 MED ORDER — LIDOCAINE 2% (20 MG/ML) 5 ML SYRINGE
INTRAMUSCULAR | Status: AC
  Filled 2016-07-02: qty 5

## 2016-07-02 MED ORDER — SODIUM CHLORIDE 0.9 % IV SOLN
100.0000 mL/h | INTRAVENOUS | Status: DC
  Administered 2016-07-02: 15:00:00 100 mL/h via INTRAVENOUS
  Filled 2016-07-02 (×4): qty 1000

## 2016-07-02 MED ORDER — DOCUSATE SODIUM 100 MG PO CAPS
100.0000 mg | ORAL_CAPSULE | Freq: Two times a day (BID) | ORAL | Status: DC
  Administered 2016-07-02 – 2016-07-03 (×2): 100 mg via ORAL
  Filled 2016-07-02 (×2): qty 1

## 2016-07-02 MED ORDER — ONDANSETRON HCL 4 MG/2ML IJ SOLN
INTRAMUSCULAR | Status: AC
  Filled 2016-07-02: qty 2

## 2016-07-02 MED ORDER — CEFAZOLIN SODIUM-DEXTROSE 2-4 GM/100ML-% IV SOLN
2.0000 g | Freq: Four times a day (QID) | INTRAVENOUS | Status: AC
  Administered 2016-07-02 (×2): 2 g via INTRAVENOUS
  Filled 2016-07-02 (×2): qty 100

## 2016-07-02 MED ORDER — ONDANSETRON HCL 4 MG PO TABS: 4.0000 mg | ORAL_TABLET | Freq: Four times a day (QID) | ORAL | Status: DC | PRN

## 2016-07-02 MED ORDER — STERILE WATER FOR IRRIGATION IR SOLN
Status: DC | PRN
  Administered 2016-07-02: 2000 mL

## 2016-07-02 MED ORDER — CEFAZOLIN SODIUM-DEXTROSE 2-4 GM/100ML-% IV SOLN
2.0000 g | INTRAVENOUS | Status: AC
  Administered 2016-07-02: 2 g via INTRAVENOUS

## 2016-07-02 MED ORDER — SUCCINYLCHOLINE CHLORIDE 200 MG/10ML IV SOSY
PREFILLED_SYRINGE | INTRAVENOUS | Status: DC | PRN
  Administered 2016-07-02: 100 mg via INTRAVENOUS

## 2016-07-02 SURGICAL SUPPLY — 39 items
ADH SKN CLS APL DERMABOND .7 (GAUZE/BANDAGES/DRESSINGS) ×1
BAG SPEC THK2 15X12 ZIP CLS (MISCELLANEOUS)
BAG ZIPLOCK 12X15 (MISCELLANEOUS) IMPLANT
BLADE SAG 18X100X1.27 (BLADE) ×3 IMPLANT
CAPT HIP TOTAL 2 ×2 IMPLANT
CLOTH BEACON ORANGE TIMEOUT ST (SAFETY) ×3 IMPLANT
COVER PERINEAL POST (MISCELLANEOUS) ×3 IMPLANT
COVER SURGICAL LIGHT HANDLE (MISCELLANEOUS) ×3 IMPLANT
DERMABOND ADVANCED (GAUZE/BANDAGES/DRESSINGS) ×2
DERMABOND ADVANCED .7 DNX12 (GAUZE/BANDAGES/DRESSINGS) ×1 IMPLANT
DRAPE STERI IOBAN 125X83 (DRAPES) ×3 IMPLANT
DRAPE U-SHAPE 47X51 STRL (DRAPES) ×6 IMPLANT
DRESSING AQUACEL AG SP 3.5X10 (GAUZE/BANDAGES/DRESSINGS) ×1 IMPLANT
DRSG AQUACEL AG SP 3.5X10 (GAUZE/BANDAGES/DRESSINGS) ×3
DRSG TEGADERM 4X4.75 (GAUZE/BANDAGES/DRESSINGS) ×2 IMPLANT
DURAPREP 26ML APPLICATOR (WOUND CARE) ×3 IMPLANT
ELECT REM PT RETURN 15FT ADLT (MISCELLANEOUS) ×3 IMPLANT
GLOVE BIOGEL M STRL SZ7.5 (GLOVE) IMPLANT
GLOVE BIOGEL PI IND STRL 6.5 (GLOVE) IMPLANT
GLOVE BIOGEL PI IND STRL 7.5 (GLOVE) ×1 IMPLANT
GLOVE BIOGEL PI IND STRL 8.5 (GLOVE) ×1 IMPLANT
GLOVE BIOGEL PI INDICATOR 6.5 (GLOVE) ×2
GLOVE BIOGEL PI INDICATOR 7.5 (GLOVE) ×8
GLOVE BIOGEL PI INDICATOR 8.5 (GLOVE) ×2
GLOVE ECLIPSE 8.0 STRL XLNG CF (GLOVE) ×6 IMPLANT
GLOVE ORTHO TXT STRL SZ7.5 (GLOVE) ×3 IMPLANT
GLOVE SURG SS PI 7.0 STRL IVOR (GLOVE) ×2 IMPLANT
GOWN STRL REUS W/TWL LRG LVL3 (GOWN DISPOSABLE) ×3 IMPLANT
GOWN STRL REUS W/TWL XL LVL3 (GOWN DISPOSABLE) ×7 IMPLANT
HOLDER FOLEY CATH W/STRAP (MISCELLANEOUS) ×3 IMPLANT
PACK ANTERIOR HIP CUSTOM (KITS) ×3 IMPLANT
SUT MNCRL AB 4-0 PS2 18 (SUTURE) ×3 IMPLANT
SUT STRATAFIX 0 PDS 27 VIOLET (SUTURE) ×3
SUT VIC AB 1 CT1 36 (SUTURE) ×9 IMPLANT
SUT VIC AB 2-0 CT1 27 (SUTURE) ×6
SUT VIC AB 2-0 CT1 TAPERPNT 27 (SUTURE) ×2 IMPLANT
SUTURE STRATFX 0 PDS 27 VIOLET (SUTURE) ×1 IMPLANT
TRAY FOLEY W/METER SILVER 16FR (SET/KITS/TRAYS/PACK) ×2 IMPLANT
YANKAUER SUCT BULB TIP 10FT TU (MISCELLANEOUS) IMPLANT

## 2016-07-02 NOTE — Evaluation (Signed)
Physical Therapy Evaluation Patient Details Name: Jon Sexton MRN: [id number] DOB: 1935-09-20 Today's Date: 07/02/2016   History of Present Illness  s/p L THA  Clinical Impression  Pt is s/p THA resulting in the deficits listed below (see PT Problem List). * Pt will benefit from skilled PT to increase their independence and safety with mobility to allow discharge to the venue listed below.      Follow Up Recommendations No PT follow up (per MD)    Equipment Recommendations  None recommended by PT    Recommendations for Other Services       Precautions / Restrictions Precautions Precautions: Fall Restrictions Weight Bearing Restrictions: No Other Position/Activity Restrictions: WBAT      Mobility  Bed Mobility Overal bed mobility: Needs Assistance Bed Mobility: Supine to Sit     Supine to sit: Min assist     General bed mobility comments: assist with trunk to upright and cues for technique  Transfers Overall transfer level: Needs assistance Equipment used: Rolling walker (2 wheeled) Transfers: Sit to/from Stand Sit to Stand: Min assist         General transfer comment: cues for hand placement  Ambulation/Gait Ambulation/Gait assistance: Min assist;Min guard Ambulation Distance (Feet): 60 Feet Assistive device: Rolling walker (2 wheeled) Gait Pattern/deviations: Step-to pattern;Decreased weight shift to left     General Gait Details: cues for sequence  Stairs            Wheelchair Mobility    Modified Rankin (Stroke Patients Only)       Balance                                             Pertinent Vitals/Pain Pain Assessment: Faces Faces Pain Scale: Hurts a little bit Pain Location: L hip Pain Descriptors / Indicators: Sore Pain Intervention(s): Limited activity within patient's tolerance;Monitored during session    Home Living Family/patient expects to be discharged to:: Private residence Living Arrangements:  Alone Available Help at Discharge: Neighbor Type of Home: House       Home Layout: One level Home Equipment: Environmental consultant - 2 wheels;Bedside commode Additional Comments: neighbor helps with laundry, mows lawn, gets meals and will continue to do so after surgery    Prior Function Level of Independence: Independent               Hand Dominance        Extremity/Trunk Assessment   Upper Extremity Assessment Upper Extremity Assessment: Overall WFL for tasks assessed    Lower Extremity Assessment Lower Extremity Assessment: LLE deficits/detail LLE Deficits / Details: grossly 3 to 3+/5       Communication   Communication: No difficulties  Cognition Arousal/Alertness: Awake/alert Behavior During Therapy: WFL for tasks assessed/performed Overall Cognitive Status: Within Functional Limits for tasks assessed                                        General Comments      Exercises Total Joint Exercises Ankle Circles/Pumps: AROM;10 reps;Both   Assessment/Plan    PT Assessment Patient needs continued PT services  PT Problem List Decreased activity tolerance;Decreased mobility;Decreased safety awareness;Decreased knowledge of use of DME;Decreased strength       PT Treatment Interventions DME instruction;Gait training;Functional mobility training;Therapeutic exercise;Therapeutic activities;Patient/family education  PT Goals (Current goals can be found in the Care Plan section)  Acute Rehab PT Goals Patient Stated Goal: home, back to IND PT Goal Formulation: With patient Time For Goal Achievement: 07/09/16 Potential to Achieve Goals: Good    Frequency 7X/week   Barriers to discharge        Co-evaluation               End of Session Equipment Utilized During Treatment: Gait belt Activity Tolerance: Patient tolerated treatment well Patient left: in chair;with call bell/phone within reach;with chair alarm set;with family/visitor present Nurse  Communication: Mobility status PT Visit Diagnosis: Difficulty in walking, not elsewhere classified (R26.2)    Time: 8887-5797 PT Time Calculation (min) (ACUTE ONLY): 16 min   Charges:   PT Evaluation $PT Eval Low Complexity: 1 Procedure     PT G CodesKenyon Ana, PT Pager: [phone] 07/02/2016   [facility] 07/02/2016, 3:46 PM

## 2016-07-02 NOTE — Anesthesia Procedure Notes (Signed)
Procedure Name: Intubation Date/Time: 07/02/2016 8:43 AM Performed by: Maxwell Caul Pre-anesthesia Checklist: Patient identified, Emergency Drugs available, Suction available and Patient being monitored Patient Re-evaluated:Patient Re-evaluated prior to inductionOxygen Delivery Method: Circle system utilized Preoxygenation: Pre-oxygenation with 100% oxygen Intubation Type: IV induction Ventilation: Mask ventilation without difficulty Laryngoscope Size: Mac and 4 Grade View: Grade II Tube type: Oral Tube size: 7.5 mm Number of attempts: 1 Airway Equipment and Method: Stylet and Oral airway Placement Confirmation: ETT inserted through vocal cords under direct vision,  positive ETCO2 and breath sounds checked- equal and bilateral Secured at: 21 cm Tube secured with: Tape Dental Injury: Teeth and Oropharynx as per pre-operative assessment

## 2016-07-02 NOTE — Progress Notes (Signed)
Left knee, right shin patient has skin tears, tegaderm was placed in OR. Patient doesn't remember when he did it, but says skin is fragile and he bumps things all the time.

## 2016-07-02 NOTE — Op Note (Signed)
NAME:  Jon Sexton                ACCOUNT NO.: [id number]      MEDICAL RECORD NO.: [id number]      FACILITY:  [facility]      PHYSICIAN:  Paralee Cancel D  DATE OF BIRTH:  17-May-1935     DATE OF PROCEDURE:  07/02/2016                                 OPERATIVE REPORT         PREOPERATIVE DIAGNOSIS: Left  hip osteoarthritis.      POSTOPERATIVE DIAGNOSIS:  Left hip osteoarthritis.      PROCEDURE:  Left total hip replacement through an anterior approach   utilizing DePuy THR system, component size 39mm pinnacle cup, a size 36+4 neutral   Altrex liner, a size 6 Hi Tri Lock stem with a 36+8.5 delta ceramic   ball.      SURGEON:  Pietro Cassis. Alvan Dame, M.D.      ASSISTANT:  Nehemiah Massed, PA-C     ANESTHESIA:  General.      SPECIMENS:  None.      COMPLICATIONS:  None.      BLOOD LOSS:  200 cc     DRAINS:  None.      INDICATION OF THE PROCEDURE:  Jon Sexton is a 81 y.o. male who had   presented to office for evaluation of left hip pain.  Radiographs revealed   progressive degenerative changes with bone-on-bone   articulation to the  hip joint.  The patient had painful limited range of   motion significantly affecting their overall quality of life.  The patient was failing to    respond to conservative measures, and at this point was ready   to proceed with more definitive measures.  The patient has noted progressive   degenerative changes in his hip, progressive problems and dysfunction   with regarding the hip prior to surgery.  Consent was obtained for   benefit of pain relief.  Specific risk of infection, DVT, component   failure, dislocation, need for revision surgery, as well discussion of   the anterior versus posterior approach were reviewed.  Consent was   obtained for benefit of anterior pain relief through an anterior   approach.      PROCEDURE IN DETAIL:  The patient was brought to operative theater.   Once adequate anesthesia, preoperative  antibiotics, 2gm of Ancef, 1 gm of Tranexamic Acid, and 10 mg of Decadron administered.   The patient was positioned supine on the OSI Hanna table.  Once adequate   padding of boney process was carried out, we had predraped out the hip, and  used fluoroscopy to confirm orientation of the pelvis and position.      The left hip was then prepped and draped from proximal iliac crest to   mid thigh with shower curtain technique.      Time-out was performed identifying the patient, planned procedure, and   extremity.     An incision was then made 2 cm distal and lateral to the   anterior superior iliac spine extending over the orientation of the   tensor fascia lata muscle and sharp dissection was carried down to the   fascia of the muscle and protractor placed in the soft tissues.      The fascia was  then incised.  The muscle belly was identified and swept   laterally and retractor placed along the superior neck.  Following   cauterization of the circumflex vessels and removing some pericapsular   fat, a second cobra retractor was placed on the inferior neck.  A third   retractor was placed on the anterior acetabulum after elevating the   anterior rectus.  A L-capsulotomy was along the line of the   superior neck to the trochanteric fossa, then extended proximally and   distally.  Tag sutures were placed and the retractors were then placed   intracapsular.  We then identified the trochanteric fossa and   orientation of my neck cut, confirmed this radiographically   and then made a neck osteotomy with the femur on traction.  The femoral   head was removed without difficulty or complication.  Traction was let   off and retractors were placed posterior and anterior around the   acetabulum.      The labrum and foveal tissue were debrided.  I began reaming with a 90mm   reamer and reamed up to 55 mm reamer with good bony bed preparation and a 56 mm   cup was chosen.  The final 56 mm Pinnacle  cup was then impacted under fluoroscopy  to confirm the depth of penetration and orientation with respect to   abduction.  A screw was placed followed by the hole eliminator.  The final   36+4 neutral Altrex liner was impacted with good visualized rim fit.  The cup was positioned anatomically within the acetabular portion of the pelvis.      At this point, the femur was rolled at 80 degrees.  Further capsule was   released off the inferior aspect of the femoral neck.  I then   released the superior capsule proximally.  The hook was placed laterally   along the femur and elevated manually and held in position with the bed   hook.  The leg was then extended and adducted with the leg rolled to 100   degrees of external rotation.  Once the proximal femur was fully   exposed, I used a box osteotome to set orientation.  I then began   broaching with the starting chili pepper broach and passed this by hand and then broached up to 6.  With the 6 broach in place I chose a high offset neck and did several trial reductions.  The offset was appropriate, leg lengths   appeared to be equal best matched with the +8.5 head ball confirmed radiographically.   Given these findings, I went ahead and dislocated the hip, repositioned all   retractors and positioned the right hip in the extended and abducted position.  The final 6 Hi Tri Lock stem was   chosen and it was impacted down to the level of neck cut.  Based on this   and the trial reduction, a 36+8.5 delta ceramic ball was chosen and   impacted onto a clean and dry trunnion, and the hip was reduced.  The   hip had been irrigated throughout the case again at this point.  I did   reapproximate the superior capsular leaflet to the anterior leaflet   using #1 Vicryl.  The fascia of the   tensor fascia lata muscle was then reapproximated using #1 Vicryl and #0 V-lock sutures.  The   remaining wound was closed with 2-0 Vicryl and running 4-0 Monocryl.   The hip  was  cleaned, dried, and dressed sterilely using Dermabond and   Aquacel dressing.  He was then brought   to recovery room in stable condition tolerating the procedure well.    Nehemiah Massed, PA-C was present for the entirety of the case involved from   preoperative positioning, perioperative retractor management, general   facilitation of the case, as well as primary wound closure as assistant.            Pietro Cassis Alvan Dame, M.D.        07/02/2016 8:57 AM

## 2016-07-02 NOTE — Anesthesia Postprocedure Evaluation (Signed)
Anesthesia Post Note  Patient: Jon Sexton  Procedure(s) Performed: Procedure(s) (LRB): LEFT TOTAL HIP ARTHROPLASTY ANTERIOR APPROACH (Left)  Patient location during evaluation: PACU Anesthesia Type: General Level of consciousness: awake and alert and patient cooperative Pain management: pain level controlled Vital Signs Assessment: post-procedure vital signs reviewed and stable Respiratory status: spontaneous breathing and respiratory function stable Cardiovascular status: stable Anesthetic complications: no       Last Vitals:  Vitals:   07/02/16 1205 07/02/16 1259  BP: (!) 141/63 129/66  Pulse: 93 94  Resp: 16 16  Temp: 36.3 C 36.4 C    Last Pain:  Vitals:   07/02/16 1259  TempSrc: Oral  PainSc:                  [place] S

## 2016-07-02 NOTE — Transfer of Care (Signed)
Immediate Anesthesia Transfer of Care Note  Patient: Jon Sexton  Procedure(s) Performed: Procedure(s): LEFT TOTAL HIP ARTHROPLASTY ANTERIOR APPROACH (Left)  Patient Location: PACU  Anesthesia Type:General  Level of Consciousness:  sedated, patient cooperative and responds to stimulation  Airway & Oxygen Therapy:Patient Spontanous Breathing and Patient connected to face mask oxgen  Post-op Assessment:  Report given to PACU RN and Post -op Vital signs reviewed and stable  Post vital signs:  Reviewed and stable  Last Vitals:  Vitals:   07/02/16 0636 07/02/16 1040  BP: 130/62 (!) (P) 144/71  Pulse: (!) 59   Resp: 18   Temp: 80.3 C     Complications: No apparent anesthesia complications

## 2016-07-02 NOTE — Interval H&P Note (Signed)
History and Physical Interval Note:  07/02/2016 7:40 AM  Jon Sexton  has presented today for surgery, with the diagnosis of left hip osteoarthritis  The various methods of treatment have been discussed with the patient and family. After consideration of risks, benefits and other options for treatment, the patient has consented to  Procedure(s): LEFT TOTAL HIP ARTHROPLASTY ANTERIOR APPROACH (Left) as a surgical intervention .  The patient's history has been reviewed, patient examined, no change in status, stable for surgery.  I have reviewed the patient's chart and labs.  Questions were answered to the patient's satisfaction.     Mauri Pole

## 2016-07-02 NOTE — Anesthesia Preprocedure Evaluation (Addendum)
Anesthesia Evaluation  Patient identified by MRN, date of birth, ID band Patient awake    Reviewed: Allergy & Precautions, H&P , NPO status , Patient's Chart, lab work & pertinent test results  Airway Mallampati: II   Neck ROM: full    Dental   Pulmonary former smoker,    breath sounds clear to auscultation       Cardiovascular hypertension,  Rhythm:regular Rate:Normal     Neuro/Psych    GI/Hepatic GERD  ,  Endo/Other    Renal/GU      Musculoskeletal  (+) Arthritis ,   Abdominal   Peds  Hematology   Anesthesia Other Findings   Reproductive/Obstetrics                             Anesthesia Physical Anesthesia Plan  ASA: II  Anesthesia Plan: General   Post-op Pain Management:    Induction: Intravenous  Airway Management Planned: Oral ETT  Additional Equipment:   Intra-op Plan:   Post-operative Plan: Extubation in OR  Informed Consent: I have reviewed the patients History and Physical, chart, labs and discussed the procedure including the risks, benefits and alternatives for the proposed anesthesia with the patient or authorized representative who has indicated his/her understanding and acceptance.     Plan Discussed with: CRNA, Anesthesiologist and Surgeon  Anesthesia Plan Comments:        Anesthesia Quick Evaluation

## 2016-07-03 DIAGNOSIS — E663 Overweight: Secondary | ICD-10-CM | POA: Diagnosis present

## 2016-07-03 LAB — BASIC METABOLIC PANEL
Anion gap: 8 (ref 5–15)
BUN: 20 mg/dL (ref 6–20)
CHLORIDE: 98 mmol/L — AB (ref 101–111)
CO2: 27 mmol/L (ref 22–32)
CREATININE: 1.09 mg/dL (ref 0.61–1.24)
Calcium: 8.9 mg/dL (ref 8.9–10.3)
Glucose, Bld: 126 mg/dL — ABNORMAL HIGH (ref 65–99)
POTASSIUM: 4.2 mmol/L (ref 3.5–5.1)
SODIUM: 133 mmol/L — AB (ref 135–145)

## 2016-07-03 LAB — CBC
HCT: 28.5 % — ABNORMAL LOW (ref 39.0–52.0)
HEMOGLOBIN: 9.7 g/dL — AB (ref 13.0–17.0)
MCH: 29.8 pg (ref 26.0–34.0)
MCHC: 34 g/dL (ref 30.0–36.0)
MCV: 87.7 fL (ref 78.0–100.0)
PLATELETS: 95 10*3/uL — AB (ref 150–400)
RBC: 3.25 MIL/uL — ABNORMAL LOW (ref 4.22–5.81)
RDW: 15.8 % — ABNORMAL HIGH (ref 11.5–15.5)
WBC: 8.4 10*3/uL (ref 4.0–10.5)

## 2016-07-03 MED ORDER — DOCUSATE SODIUM 100 MG PO CAPS: 100.0000 mg | ORAL_CAPSULE | Freq: Two times a day (BID) | ORAL | 0 refills | Status: DC

## 2016-07-03 MED ORDER — METHOCARBAMOL 500 MG PO TABS: 500.0000 mg | ORAL_TABLET | Freq: Four times a day (QID) | ORAL | 0 refills | Status: DC | PRN

## 2016-07-03 MED ORDER — POLYETHYLENE GLYCOL 3350 17 G PO PACK: 17.0000 g | PACK | Freq: Two times a day (BID) | ORAL | 0 refills | Status: AC

## 2016-07-03 MED ORDER — FERROUS SULFATE 325 (65 FE) MG PO TABS: 325.0000 mg | ORAL_TABLET | Freq: Three times a day (TID) | ORAL | 3 refills | Status: DC

## 2016-07-03 MED ORDER — ASPIRIN 81 MG PO CHEW: 81.0000 mg | CHEWABLE_TABLET | Freq: Two times a day (BID) | ORAL | 0 refills | Status: DC

## 2016-07-03 MED ORDER — HYDROCODONE-ACETAMINOPHEN 7.5-325 MG PO TABS: 1.0000 | ORAL_TABLET | ORAL | 0 refills | Status: DC | PRN

## 2016-07-03 NOTE — Progress Notes (Signed)
He will dc home with assist from his neighbor. He lives in a 2 story home with 1 ste. No DME needs. No formal PT postop. [phone]

## 2016-07-03 NOTE — Progress Notes (Signed)
     Subjective: 1 Day Post-Op Procedure(s) (LRB): LEFT TOTAL HIP ARTHROPLASTY ANTERIOR APPROACH (Left)   Patient reports pain as mild, pain controlled.  No events throughout the night. Ready to be discharged home if he does well with PT.   Objective:   VITALS:   Vitals:   07/03/16 0206 07/03/16 0533  BP: 133/63 (!) 121/56  Pulse: 72 77  Resp: 16 16  Temp: 98.9 F (37.2 C) 98.2 F (36.8 C)    Dorsiflexion/Plantar flexion intact Incision: dressing C/D/I No cellulitis present Compartment soft  LABS  Recent Labs  07/03/16 0355  HGB 9.7*  HCT 28.5*  WBC 8.4  PLT 95*     Recent Labs  07/03/16 0355  NA 133*  K 4.2  BUN 20  CREATININE 1.09  GLUCOSE 126*     Assessment/Plan: 1 Day Post-Op Procedure(s) (LRB): LEFT TOTAL HIP ARTHROPLASTY ANTERIOR APPROACH (Left) Foley cath d/c'ed Advance diet Up with therapy D/C IV fluids Discharge home Follow up in 2 weeks at [facility]. Follow up with OLIN,Deryn Massengale D in 2 weeks.  Contact information:  [facility] [address], [place] [phone]    Overweight (BMI 25-29.9) Estimated body mass index is 29.8 kg/m as calculated from the following:   Height as of this encounter: 5\' 8"  (1.727 m).   Weight as of this encounter: 88.9 kg (196 lb). Patient also counseled that weight may inhibit the healing process Patient counseled that losing weight will help with future health issues       Jon Sexton. Jon Sexton   PAC  07/03/2016, 7:46 AM

## 2016-07-03 NOTE — Evaluation (Signed)
Occupational Therapy Evaluation Patient Details Name: Jon Sexton MRN: [id number] DOB: November 06, 1935 Today's Date: 07/03/2016    History of Present Illness s/p L THA   Clinical Impression   This 81 year old man was admitted for the above sx. All education was completed. No further OT is needed at this time    Follow Up Recommendations  Supervision/Assistance - 24 hour    Equipment Recommendations  None recommended by OT (has BSC and shower seat)    Recommendations for Other Services       Precautions / Restrictions Precautions Precautions: Fall Restrictions Other Position/Activity Restrictions: WBAT      Mobility Bed Mobility               General bed mobility comments: oob  Transfers   Equipment used: Rolling walker (2 wheeled)   Sit to Stand: Min guard         General transfer comment: cues for hand placement    Balance                                           ADL either performed or assessed with clinical judgement   ADL Overall ADL's : Needs assistance/impaired     Grooming: Oral care;Standing;Supervision/safety   Upper Body Bathing: Set up;Sitting   Lower Body Bathing: Minimal assistance;Sit to/from stand   Upper Body Dressing : Min guard;Sitting   Lower Body Dressing: Moderate assistance;Sit to/from stand   Toilet Transfer: Min guard;Ambulation;BSC;RW   Toileting- Water quality scientist and Hygiene: Min guard;Sit to/from stand   Tub/ Shower Transfer: Walk-in shower;Min guard;Ambulation;Rolling walker     General ADL Comments: pt is not interested in AE. He will have neighbor assist as needed. Practiced bathroom transfers; cues at times to stay within walker and cues for hand placement when standing up     Vision         Perception     Praxis      Pertinent Vitals/Pain Pain Assessment: Faces Faces Pain Scale: Hurts a little bit Pain Location: L hip Pain Descriptors / Indicators: Sore Pain  Intervention(s): Limited activity within patient's tolerance;Monitored during session;Premedicated before session;Repositioned;Ice applied     Hand Dominance     Extremity/Trunk Assessment Upper Extremity Assessment Upper Extremity Assessment: Overall WFL for tasks assessed           Communication Communication Communication: No difficulties   Cognition Arousal/Alertness: Awake/alert Behavior During Therapy: WFL for tasks assessed/performed Overall Cognitive Status: Within Functional Limits for tasks assessed                                     General Comments       Exercises     Shoulder Instructions      Home Living Family/patient expects to be discharged to:: Private residence Living Arrangements: Alone Available Help at Discharge: Neighbor Type of Home: House             Bathroom Shower/Tub: Walk-in Corporate treasurer Toilet: Standard     Home Equipment: Shower seat;Bedside commode   Additional Comments: neighbor helps with laundry, mows lawn, gets meals and will continue to do so after surgery.  Neighbor will help with adls as needed      Prior Functioning/Environment Level of Independence: Independent  Comments: used to have AE but never used it; not interested in this        OT Problem List:        OT Treatment/Interventions:      OT Goals(Current goals can be found in the care plan section) Acute Rehab OT Goals Patient Stated Goal: home, back to IND OT Goal Formulation: All assessment and education complete, DC therapy  OT Frequency:     Barriers to D/C:            Co-evaluation              End of Session    Activity Tolerance: Patient tolerated treatment well Patient left: in chair;with call bell/phone within reach;with chair alarm set  OT Visit Diagnosis: Pain Pain - Right/Left: Left Pain - part of body: Hip                Time: 1594-7076 OT Time Calculation (min): 16 min Charges:  OT General  Charges $OT Visit: 1 Procedure OT Evaluation $OT Eval Low Complexity: 1 Procedure G-Codes:     Jon Sexton, OTR/L [phone] 07/03/2016  Jon Sexton 07/03/2016, 8:23 AM

## 2016-07-03 NOTE — Progress Notes (Signed)
Physical Therapy Treatment Patient Details Name: Jon Sexton MRN: [id number] DOB: [birth year] Today's Date: 07/03/2016    History of Present Illness s/p L THA    PT Comments    Pt progressing well; will see again in pm   Follow Up Recommendations  No PT follow up (per MD)     Equipment Recommendations  None recommended by PT    Recommendations for Other Services       Precautions / Restrictions Precautions Precautions: Fall Restrictions Other Position/Activity Restrictions: WBAT    Mobility  Bed Mobility               General bed mobility comments: oob  Transfers Overall transfer level: Needs assistance Equipment used: Rolling walker (2 wheeled) Transfers: Sit to/from Stand Sit to Stand: Min guard         General transfer comment: cues for hand placement  Ambulation/Gait Ambulation/Gait assistance: Supervision;Min guard Ambulation Distance (Feet): 90 Feet Assistive device: Rolling walker (2 wheeled) Gait Pattern/deviations: Step-to pattern;Decreased weight shift to left     General Gait Details: cues for sequence and RW safety with turns   Financial trader Rankin (Stroke Patients Only)       Balance                                            Cognition Arousal/Alertness: Awake/alert Behavior During Therapy: WFL for tasks assessed/performed Overall Cognitive Status: Within Functional Limits for tasks assessed                                        Exercises Total Joint Exercises Ankle Circles/Pumps: AROM;10 reps;Both Quad Sets: AROM;Strengthening;Both;10 reps Short Arc Quad: AROM;Strengthening;Left;10 reps Heel Slides: AAROM;Left;10 reps Hip ABduction/ADduction: AROM;Left;10 reps;Strengthening    General Comments        Pertinent Vitals/Pain Pain Assessment: 0-10 Pain Score: 2  Pain Location: L hip Pain Descriptors / Indicators: Sore Pain  Intervention(s): Limited activity within patient's tolerance;Monitored during session;Premedicated before session;Repositioned    Home Living                      Prior Function            PT Goals (current goals can now be found in the care plan section) Acute Rehab PT Goals Patient Stated Goal: home, back to IND PT Goal Formulation: With patient Time For Goal Achievement: 07/09/16 Potential to Achieve Goals: Good Progress towards PT goals: Progressing toward goals    Frequency    7X/week      PT Plan Current plan remains appropriate    Co-evaluation             End of Session Equipment Utilized During Treatment: Gait belt Activity Tolerance: Patient tolerated treatment well Patient left: in chair;with call bell/phone within reach;with chair alarm set Nurse Communication: Mobility status PT Visit Diagnosis: Difficulty in walking, not elsewhere classified (R26.2)     Time: 0932-3557 PT Time Calculation (min) (ACUTE ONLY): 18 min  Charges:  $Gait Training: 8-22 mins                    G Codes:       Jon Sexton  [place], PT Pager: [phone] 07/03/2016    Jon Sexton 07/03/2016, 1:23 PM

## 2016-07-03 NOTE — Progress Notes (Signed)
   07/03/16 1500  PT Visit Information  Last PT Received On 07/03/16  Assistance Needed +1  History of Present Illness s/p L THA  Subjective Data  Patient Stated Goal home, back to [facility]  Precautions  Precautions Fall  Restrictions  Other Position/Activity Restrictions WBAT  Pain Assessment  Pain Assessment 0-10  Pain Score 2  Pain Location L hip  Pain Descriptors / Indicators Sore  Pain Intervention(s) Limited activity within patient's tolerance;Monitored during session;Premedicated before session  Cognition  Arousal/Alertness Awake/alert  Behavior During Therapy WFL for tasks assessed/performed  Overall Cognitive Status Within Functional Limits for tasks assessed  Bed Mobility  General bed mobility comments in chair  Transfers  Overall transfer level Needs assistance  Equipment used Rolling walker (2 wheeled)  Transfers Sit to/from Stand  Sit to Stand Min guard;Supervision  General transfer comment cues for hand placement  Ambulation/Gait  Ambulation/Gait assistance Supervision;Min guard  Ambulation Distance (Feet) 100 Feet  Assistive device Rolling walker (2 wheeled)  Gait Pattern/deviations Step-to pattern;Decreased weight shift to left  General Gait Details cues for sequence and RW safety with turns, reinforced as pt tends to step outside walker  Stairs (no stairs to enter lower level per pt)  Total Joint Exercises  Marching in Standing AROM;Strengthening;Left;10 reps;Standing  Knee Flexion AROM;Strengthening;10 reps;Left;Standing  PT - End of Session  Equipment Utilized During Treatment Gait belt  Activity Tolerance Patient tolerated treatment well  Patient left in chair;with call bell/phone within reach;with chair alarm set  Nurse Communication Mobility status  PT - Assessment/Plan  PT Plan Current plan remains appropriate  PT Visit Diagnosis Difficulty in walking, not elsewhere classified (R26.2)  PT Frequency (ACUTE ONLY) 7X/week  Follow Up Recommendations No PT  follow up (per MD)  PT equipment None recommended by PT  AM-PAC PT "6 Clicks" Daily Activity Outcome Measure  Difficulty turning over in bed (including adjusting bedclothes, sheets and blankets)? 3  Difficulty moving from lying on back to sitting on the side of the bed?  3  Difficulty sitting down on and standing up from a chair with arms (e.g., wheelchair, bedside commode, etc,.)? 3  Help needed moving to and from a bed to chair (including a wheelchair)? 3  Help needed walking in hospital room? 3  6 Click Score 15  Mobility G Code  CK  PT Goal Progression  Progress towards PT goals Progressing toward goals  Acute Rehab PT Goals  PT Goal Formulation With patient  Time For Goal Achievement 07/09/16  Potential to Achieve Goals Good  PT Time Calculation  PT Start Time (ACUTE ONLY) 1438  PT Stop Time (ACUTE ONLY) 1447  PT Time Calculation (min) (ACUTE ONLY) 9 min  PT General Charges  $$ ACUTE PT VISIT 1 Procedure  PT Treatments  $Gait Training 8-22 mins

## 2016-07-08 NOTE — Discharge Summary (Signed)
Physician Discharge Summary  Patient ID: Jon Sexton MRN: [id number] DOB/AGE: [birth year] 81 y.o.  Admit date: 07/02/2016 Discharge date: 07/03/2016   Procedures:  Procedure(s) (LRB): LEFT TOTAL HIP ARTHROPLASTY ANTERIOR APPROACH (Left)  Attending Physician:  Dr. Paralee Cancel   Admission Diagnoses:   Left hip primary OA / pain  Discharge Diagnoses:  Principal Problem:   S/P left THA, AA Active Problems:   Overweight (BMI 25.0-29.9)  Past Medical History:  Diagnosis Date  . Arthritis   . Disc disorder    bulging disc  . GERD (gastroesophageal reflux disease)    occasionally  . Hyperlipemia   . Hypertension     HPI:    Jon Sexton, 81 y.o. male, has a history of pain and functional disability in the left hip(s) due to arthritis and patient has failed non-surgical conservative treatments for greater than 12 weeks to include NSAID's and/or analgesics, corticosteriod injections, use of assistive devices and activity modification.  Onset of symptoms was gradual starting 1+ years ago with gradually worsening course since that time.The patient noted prior procedures of the hip to include arthroplasty on the right hip(s).  Patient currently rates pain in the left hip at 8 out of 10 with activity. Patient has worsening of pain with activity and weight bearing, trendelenberg gait, pain that interfers with activities of daily living and pain with passive range of motion. Patient has evidence of periarticular osteophytes and joint space narrowing by imaging studies. This condition presents safety issues increasing the risk of falls. There is no current active infection.   Risks, benefits and expectations were discussed with the patient.  Risks including but not limited to the risk of anesthesia, blood clots, nerve damage, blood vessel damage, failure of the prosthesis, infection and up to and including death.  Patient understand the risks, benefits and expectations and wishes to proceed with  surgery.  PCP: Emelda Fear, DO   Discharged Condition: good  Hospital Course:  Patient underwent the above stated procedure on 07/02/2016. Patient tolerated the procedure well and brought to the recovery room in good condition and subsequently to the floor.  POD #1 BP: 121/56 ; Pulse: 77 ; Temp: 98.2 F (36.8 C) ; Resp: 16 Patient reports pain as mild, pain controlled.  No events throughout the night. Ready to be discharged home. Dorsiflexion/plantar flexion intact, incision: dressing C/D/I, no cellulitis present and compartment soft.   LABS  Basename    HGB     9.7  HCT     28.5    Discharge Exam: General appearance: alert, cooperative and no distress Extremities: Homans sign is negative, no sign of DVT, no edema, redness or tenderness in the calves or thighs and no ulcers, gangrene or trophic changes  Disposition: Home with follow up in 2 weeks   Follow-up Information    Mauri Pole, MD. Schedule an appointment as soon as possible for a visit in 2 week(s).   Specialty:  Orthopedic Surgery Contact information: [address] [place] [address] [phone]           Discharge Instructions    Call MD / Call 911    Complete by:  As directed    If you experience chest pain or shortness of breath, CALL 911 and be transported to the hospital emergency room.  If you develope a fever above 101 F, pus (white drainage) or increased drainage or redness at the wound, or calf pain, call your surgeon's office.   Change dressing  Complete by:  As directed    Maintain surgical dressing until follow up in the clinic. If the edges start to pull up, may reinforce with tape. If the dressing is no longer working, may remove and cover with gauze and tape, but must keep the area dry and clean.  Call with any questions or concerns.   Constipation Prevention    Complete by:  As directed    Drink plenty of fluids.  Prune juice may be helpful.  You may use a stool  softener, such as Colace (over the counter) 100 mg twice a day.  Use MiraLax (over the counter) for constipation as needed.   Diet - low sodium heart healthy    Complete by:  As directed    Discharge instructions    Complete by:  As directed    Maintain surgical dressing until follow up in the clinic. If the edges start to pull up, may reinforce with tape. If the dressing is no longer working, may remove and cover with gauze and tape, but must keep the area dry and clean.  Follow up in 2 weeks at [facility]. Call with any questions or concerns.   Increase activity slowly as tolerated    Complete by:  As directed    Weight bearing as tolerated with assist device (walker, cane, etc) as directed, use it as long as suggested by your surgeon or therapist, typically at least 4-6 weeks.   TED hose    Complete by:  As directed    Use stockings (TED hose) for 2 weeks on both leg(s).  You may remove them at night for sleeping.      Allergies as of 07/03/2016   No Known Allergies     Medication List    STOP taking these medications   aspirin EC 81 MG tablet Replaced by:  aspirin 81 MG chewable tablet   oxyCODONE-acetaminophen 5-325 MG tablet Commonly known as:  PERCOCET/ROXICET     TAKE these medications   allopurinol 100 MG tablet Commonly known as:  ZYLOPRIM Take 1 tablet by mouth daily.   ALPRAZolam 1 MG tablet Commonly known as:  XANAX Take 1 mg by mouth at bedtime as needed. Sleep   aspirin 81 MG chewable tablet Chew 1 tablet (81 mg total) by mouth 2 (two) times daily. Take for 4 weeks. Replaces:  aspirin EC 81 MG tablet   cholecalciferol 1000 units tablet Commonly known as:  VITAMIN D Take 1,000 Units by mouth daily.   CoQ10 100 MG Caps Take 1 capsule by mouth daily.   cyanocobalamin 1000 MCG/ML injection Commonly known as:  (VITAMIN B-12) Inject 1,000 mcg into the muscle every 30 (thirty) days.   docusate sodium 100 MG capsule Commonly known as:   COLACE Take 1 capsule (100 mg total) by mouth 2 (two) times daily.   ferrous sulfate 325 (65 FE) MG tablet Take 1 tablet (325 mg total) by mouth 3 (three) times daily after meals.   Flaxseed Oil 1000 MG Caps Take 1 capsule by mouth daily.   HYDROcodone-acetaminophen 7.5-325 MG tablet Commonly known as:  NORCO Take 1-2 tablets by mouth every 4 (four) hours as needed for moderate pain.   L-Arginine Powd Take 1 scoop by mouth daily.   lisinopril 40 MG tablet Commonly known as:  PRINIVIL,ZESTRIL Take 40 mg by mouth daily.   magnesium gluconate 500 MG tablet Commonly known as:  MAGONATE Take 500 mg by mouth daily.   methocarbamol 500 MG tablet Commonly  known as:  ROBAXIN Take 1 tablet (500 mg total) by mouth every 6 (six) hours as needed for muscle spasms.   polyethylene glycol packet Commonly known as:  MIRALAX / GLYCOLAX Take 17 g by mouth 2 (two) times daily. What changed:  when to take this   tamsulosin 0.4 MG Caps capsule Commonly known as:  FLOMAX Take 1 capsule by mouth daily.   VENTOLIN HFA 108 (90 Base) MCG/ACT inhaler Generic drug:  albuterol Inhale 1-2 puffs into the lungs every 4 (four) hours as needed for wheezing or shortness of breath.        Signed: [name]. Brynja Marker   PA-C  07/08/2016, 2:54 PM

## 2017-01-27 LAB — COLOGUARD

## 2017-02-17 ENCOUNTER — Encounter (INDEPENDENT_AMBULATORY_CARE_PROVIDER_SITE_OTHER): Payer: Self-pay | Admitting: Internal Medicine

## 2017-02-25 ENCOUNTER — Encounter (INDEPENDENT_AMBULATORY_CARE_PROVIDER_SITE_OTHER): Payer: Self-pay | Admitting: Internal Medicine

## 2017-02-25 ENCOUNTER — Encounter (INDEPENDENT_AMBULATORY_CARE_PROVIDER_SITE_OTHER): Payer: Self-pay

## 2017-02-25 ENCOUNTER — Ambulatory Visit (INDEPENDENT_AMBULATORY_CARE_PROVIDER_SITE_OTHER): Payer: Medicare Other | Admitting: Internal Medicine

## 2017-02-25 ENCOUNTER — Encounter (INDEPENDENT_AMBULATORY_CARE_PROVIDER_SITE_OTHER): Payer: Self-pay | Admitting: *Deleted

## 2017-02-25 DIAGNOSIS — M109 Gout, unspecified: Secondary | ICD-10-CM

## 2017-02-25 DIAGNOSIS — N400 Enlarged prostate without lower urinary tract symptoms: Secondary | ICD-10-CM

## 2017-02-25 DIAGNOSIS — R195 Other fecal abnormalities: Secondary | ICD-10-CM

## 2017-02-25 HISTORY — DX: Benign prostatic hyperplasia without lower urinary tract symptoms: N40.0

## 2017-02-25 HISTORY — DX: Gout, unspecified: M10.9

## 2017-02-25 HISTORY — DX: Other fecal abnormalities: R19.5

## 2017-02-25 NOTE — Progress Notes (Signed)
   Subjective:    Patient ID: Jon Sexton, male    DOB: 07/05/1935, 81 y.o.   MRN: [id number]  HPI Referred by Dr. Chauncey Reading for positive cologuard/colonoscopy.  Last colonoscopy in 2013 with one hyperplastic polyp.  No rectal bleeding. Hx of constipation and takes Amitiza. No abdominal pain. He does have leg and pain in his fingers. Has a BM daily with Amitiza. Appetite is good for the most part. No weight loss. No GI complaints.    His last colonoscopy was in 2013 (average risk).   Impression:  Examination performed to cecum. Examination somewhat compromised because of quality of prep. Small polyp ablated via cold biopsy from transverse colon. Left-sided diverticulosis. Small external hemorrhoids. Biopsy: Hyperplastic polyp.  Review of Systems     Objective:   Physical Exam  Blood pressure (!) 154/76, pulse 72, temperature 97.6 F (36.4 C), height 5' 6.5" (1.689 m), weight 202 lb 3.2 oz (91.7 kg). Alert and oriented. Skin warm and dry. Oral mucosa is moist.   . Sclera anicteric, conjunctivae is pink. Thyroid not enlarged. No cervical lymphadenopathy. Lungs clear. Heart regular rate and rhythm.  Abdomen is soft. Bowel sounds are positive. No hepatomegaly. No abdominal masses felt. No tenderness.  No edema to lower extremities.          Assessment & Plan:  Positive cologuard. Colonic neoplasm needs to be ruled out.

## 2017-02-25 NOTE — Patient Instructions (Signed)
Colonoscopy. The risks of bleeding, perforation and infection were reviewed with patient.  

## 2017-02-25 NOTE — Telephone Encounter (Signed)
Patient needs trilyte 

## 2017-02-25 NOTE — Telephone Encounter (Signed)
This encounter was created in error - please disregard.

## 2017-04-10 ENCOUNTER — Encounter (HOSPITAL_COMMUNITY)
Admission: RE | Disposition: A | Discharge status: Home / Self Care | Payer: Self-pay | Source: Ambulatory Visit | Attending: Internal Medicine

## 2017-04-10 ENCOUNTER — Ambulatory Visit (HOSPITAL_COMMUNITY)
Admission: RE | Admit: 2017-04-10 | Discharge: 2017-04-10 | Disposition: A | Discharge status: Home / Self Care | Payer: Medicare Other | Source: Ambulatory Visit | Attending: Internal Medicine | Admitting: Internal Medicine

## 2017-04-10 ENCOUNTER — Encounter (HOSPITAL_COMMUNITY): Payer: Self-pay | Admitting: *Deleted

## 2017-04-10 ENCOUNTER — Other Ambulatory Visit: Payer: Self-pay

## 2017-04-10 DIAGNOSIS — I10 Essential (primary) hypertension: Secondary | ICD-10-CM | POA: Diagnosis not present

## 2017-04-10 DIAGNOSIS — D120 Benign neoplasm of cecum: Secondary | ICD-10-CM | POA: Insufficient documentation

## 2017-04-10 DIAGNOSIS — Z96643 Presence of artificial hip joint, bilateral: Secondary | ICD-10-CM | POA: Diagnosis not present

## 2017-04-10 DIAGNOSIS — D125 Benign neoplasm of sigmoid colon: Secondary | ICD-10-CM

## 2017-04-10 DIAGNOSIS — M1990 Unspecified osteoarthritis, unspecified site: Secondary | ICD-10-CM | POA: Insufficient documentation

## 2017-04-10 DIAGNOSIS — E785 Hyperlipidemia, unspecified: Secondary | ICD-10-CM | POA: Insufficient documentation

## 2017-04-10 DIAGNOSIS — Z87891 Personal history of nicotine dependence: Secondary | ICD-10-CM | POA: Diagnosis not present

## 2017-04-10 DIAGNOSIS — K5730 Diverticulosis of large intestine without perforation or abscess without bleeding: Secondary | ICD-10-CM | POA: Insufficient documentation

## 2017-04-10 DIAGNOSIS — Z79899 Other long term (current) drug therapy: Secondary | ICD-10-CM | POA: Diagnosis not present

## 2017-04-10 DIAGNOSIS — K219 Gastro-esophageal reflux disease without esophagitis: Secondary | ICD-10-CM | POA: Insufficient documentation

## 2017-04-10 DIAGNOSIS — D649 Anemia, unspecified: Secondary | ICD-10-CM | POA: Insufficient documentation

## 2017-04-10 DIAGNOSIS — N400 Enlarged prostate without lower urinary tract symptoms: Secondary | ICD-10-CM | POA: Diagnosis not present

## 2017-04-10 DIAGNOSIS — R195 Other fecal abnormalities: Secondary | ICD-10-CM | POA: Diagnosis not present

## 2017-04-10 DIAGNOSIS — D122 Benign neoplasm of ascending colon: Secondary | ICD-10-CM | POA: Insufficient documentation

## 2017-04-10 DIAGNOSIS — K648 Other hemorrhoids: Secondary | ICD-10-CM | POA: Insufficient documentation

## 2017-04-10 DIAGNOSIS — M109 Gout, unspecified: Secondary | ICD-10-CM | POA: Insufficient documentation

## 2017-04-10 HISTORY — PX: POLYPECTOMY: SHX5525

## 2017-04-10 HISTORY — PX: COLONOSCOPY: SHX5424

## 2017-04-10 SURGERY — COLONOSCOPY
Anesthesia: Moderate Sedation

## 2017-04-10 MED ORDER — MEPERIDINE HCL 50 MG/ML IJ SOLN
INTRAMUSCULAR | Status: DC | PRN
  Administered 2017-04-10 (×2): 25 mg via INTRAVENOUS

## 2017-04-10 MED ORDER — MEPERIDINE HCL 50 MG/ML IJ SOLN
INTRAMUSCULAR | Status: AC
  Filled 2017-04-10: qty 1

## 2017-04-10 MED ORDER — MIDAZOLAM HCL 5 MG/5ML IJ SOLN
INTRAMUSCULAR | Status: AC
  Filled 2017-04-10: qty 10

## 2017-04-10 MED ORDER — STERILE WATER FOR IRRIGATION IR SOLN
Status: DC | PRN
  Administered 2017-04-10: 2.5 mL

## 2017-04-10 MED ORDER — SODIUM CHLORIDE 0.9 % IV SOLN
INTRAVENOUS | Status: DC
  Administered 2017-04-10: 11:00:00 via INTRAVENOUS

## 2017-04-10 MED ORDER — MIDAZOLAM HCL 5 MG/5ML IJ SOLN
INTRAMUSCULAR | Status: DC | PRN
  Administered 2017-04-10 (×2): 1 mg via INTRAVENOUS
  Administered 2017-04-10 (×2): 2 mg via INTRAVENOUS
  Administered 2017-04-10: 1 mg via INTRAVENOUS

## 2017-04-10 NOTE — Op Note (Signed)
[facility] Patient Name: Jon Sexton Procedure Date: 04/10/2017 11:58 AM MRN: [id number] Date of Birth: Jul 28, 1935 Attending MD: Hildred Laser , MD CSN: [id number] Age: 82 Admit Type: Outpatient Procedure:                Colonoscopy Indications:              Positive Cologuard test Providers:                Hildred Laser, MD, Clayton Lefort, RN, Janeece Riggers,                            RN, Lurline Del, RN Referring MD:             Emelda Fear, DO Medicines:                Meperidine 50 mg IV, Midazolam 7 mg IV Complications:            No immediate complications. Estimated Blood Loss:     Estimated blood loss was minimal. Procedure:                Pre-Anesthesia Assessment:                           - Prior to the procedure, a History and Physical                            was performed, and patient medications and                            allergies were reviewed. The patient's tolerance of                            previous anesthesia was also reviewed. The risks                            and benefits of the procedure and the sedation                            options and risks were discussed with the patient.                            All questions were answered, and informed consent                            was obtained. Prior Anticoagulants: The patient has                            taken no previous anticoagulant or antiplatelet                            agents. ASA Grade Assessment: II - A patient with                            mild systemic disease. After reviewing the risks  and benefits, the patient was deemed in                            satisfactory condition to undergo the procedure.                           After obtaining informed consent, the colonoscope                            was passed under direct vision. Throughout the                            procedure, the patient's blood pressure, pulse, and      oxygen saturations were monitored continuously. The                            EC-[id number] ([id number]) scope was introduced through                            the anus and advanced to the the cecum, identified                            by appendiceal orifice and ileocecal valve. The                            colonoscopy was performed without difficulty. The                            patient tolerated the procedure well. The quality                            of the bowel preparation was good. The ileocecal                            valve, appendiceal orifice, and rectum were                            photographed. Scope In: 12:39:01 PM Scope Out: 1:17:19 PM Scope Withdrawal Time: 0 hours 27 minutes 7 seconds  Total Procedure Duration: 0 hours 38 minutes 18 seconds  Findings:      The perianal and digital rectal examinations were normal.      Two sessile polyps were found in the ascending colon and cecum. The       polyps were small in size. These were biopsied with a cold forceps for       histology. The pathology specimen was placed into Bottle Number 1.      A 8 mm polyp was found in the ascending colon. The polyp was sessile.       The polyp was removed with a hot snare. Resection and retrieval were       complete. The pathology specimen was placed into Bottle Number 2.      A 5 mm polyp was found in the sigmoid colon. The polyp was semi-sessile.       The polyp was removed with a cold snare. Resection and retrieval  were       complete. The pathology specimen was placed into Bottle Number 1.      Multiple small and large-mouthed diverticula were found in the sigmoid       colon.      Internal hemorrhoids were found during retroflexion. The hemorrhoids       were medium-sized.      12 mm Submucosal lipoma at transverse colon. Impression:               - Two small polyps in the ascending colon and in                            the cecum. Biopsied.                           - One  8 mm polyp in the ascending colon, removed                            with a hot snare. Resected and retrieved.                           - One 5 mm polyp in the sigmoid colon, removed with                            a cold snare. Resected and retrieved.                           - 12 mm Submucosal lipoma at transverse colon.                           - Diverticulosis in the sigmoid colon.                           - Internal hemorrhoids. Moderate Sedation:      Moderate (conscious) sedation was administered by the endoscopy nurse       and supervised by the endoscopist. The following parameters were       monitored: oxygen saturation, heart rate, blood pressure, CO2       capnography and response to care. Total physician intraservice time was       44 minutes. Recommendation:           - Patient has a contact number available for                            emergencies. The signs and symptoms of potential                            delayed complications were discussed with the                            patient. Return to normal activities tomorrow.                            Written discharge instructions were provided to the  patient.                           - High fiber diet today.                           - Continue present medications.                           - No aspirin, ibuprofen, naproxen, or other                            non-steroidal anti-inflammatory drugs for 7 days                            after polyp removal.                           - Await pathology results.                           - Repeat colonoscopy for surveillance based on                            pathology results. Procedure Code(s):        --- Professional ---                           [id number], Colonoscopy, flexible; with removal of                            tumor(s), polyp(s), or other lesion(s) by snare                            technique                            45380, 59, Colonoscopy, flexible; with biopsy,                            single or multiple                           99152, Moderate sedation services provided by the                            same physician or other qualified health care                            professional performing the diagnostic or                            therapeutic service that the sedation supports,                            requiring the presence of an independent trained  observer to assist in the monitoring of the                            patient's level of consciousness and physiological                            status; initial 15 minutes of intraservice time,                            patient age 75 years or older                           [phone], Moderate sedation services; each additional                            15 minutes intraservice time                           [phone], Moderate sedation services; each additional                            15 minutes intraservice time Diagnosis Code(s):        --- Professional ---                           D12.2, Benign neoplasm of ascending colon                           D12.0, Benign neoplasm of cecum                           D12.5, Benign neoplasm of sigmoid colon                           K64.8, Other hemorrhoids                           R19.5, Other fecal abnormalities                           K57.30, Diverticulosis of large intestine without                            perforation or abscess without bleeding CPT copyright 2016 American [employer]. All rights reserved. The codes documented in this report are preliminary and upon coder review may  be revised to meet current compliance requirements. Hildred Laser, MD Hildred Laser, MD 04/10/2017 1:30:41 PM This report has been signed electronically. Number of Addenda: 0

## 2017-04-10 NOTE — H&P (Signed)
Jon Sexton is an 82 y.o. male.   Chief Complaint: Patient is here for colonoscopy. HPI: Patient is 82 year old Caucasian male who had a positive  Cologuard test.  He denies melena or rectal bleeding.  He states he was noted to have mild anemia advised to take iron.  Iron makes his constipated.  He denies abdominal pain nausea vomiting or hematuria. First colonoscopy was in 2003 with removal of hyperplastic polyp and last colonoscopy was in December 2013 with removal of a small polyp and was hyperplastic. Family History is negative for CRC. Past Medical History:  Diagnosis Date  . Arthritis   . BPH (benign prostatic hyperplasia) 02/25/2017  . Disc disorder    bulging disc  . GERD (gastroesophageal reflux disease)    occasionally  . Gout 02/25/2017  . Hyperlipemia   . Hypertension   . Positive colorectal cancer screening using Cologuard test 02/25/2017    Past Surgical History:  Procedure Laterality Date  . COLONOSCOPY    . COLONOSCOPY  03/04/2012   Procedure: COLONOSCOPY;  Surgeon: Rogene Houston, MD;  Location: [facility] SUITE;  Service: Endoscopy;  Laterality: N/A;  1200  . EYE SURGERY     bilateral cataract  . left hip replacement    . rt hip replacement    . TONSILLECTOMY    . TOTAL HIP ARTHROPLASTY Left 07/02/2016   Procedure: LEFT TOTAL HIP ARTHROPLASTY ANTERIOR APPROACH;  Surgeon: Paralee Cancel, MD;  Location: [facility] ORS;  Service: Orthopedics;  Laterality: Left;    History reviewed. No pertinent family history. Social History:  reports that he quit smoking about 21 months ago. His smoking use included cigarettes. He has a 10.00 pack-year smoking history. he has never used smokeless tobacco. He reports that he drinks about 0.6 oz of alcohol per week. He reports that he does not use drugs.  Allergies: No Known Allergies  Medications Prior to Admission  Medication Sig Dispense Refill  . allopurinol (ZYLOPRIM) 100 MG tablet Take 1 tablet by mouth daily.    Marland Kitchen ALPRAZolam (XANAX) 1  MG tablet Take 1 mg by mouth at bedtime as needed for anxiety.    . cholecalciferol (VITAMIN D) 1000 UNITS tablet Take 1,000 Units by mouth daily.    . Coenzyme Q10 (COQ10) 100 MG CAPS Take 1 capsule by mouth daily.    . cyanocobalamin (,VITAMIN B-12,) 1000 MCG/ML injection Inject 1,000 mcg into the muscle every 30 (thirty) days.    . finasteride (PROSCAR) 5 MG tablet Take 5 mg by mouth daily.    . Flaxseed, Linseed, (FLAXSEED OIL) 1000 MG CAPS Take 1 capsule by mouth daily.    Marland Kitchen HYDROcodone-acetaminophen (NORCO) 7.5-325 MG tablet Take 1-2 tablets by mouth every 4 (four) hours as needed for moderate pain. 60 tablet 0  . L-Arginine POWD Take 1 scoop by mouth daily.    Marland Kitchen lisinopril (PRINIVIL,ZESTRIL) 40 MG tablet Take 40 mg by mouth daily.    Marland Kitchen lubiprostone (AMITIZA) 24 MCG capsule Take 24 mcg by mouth daily with breakfast.    . magnesium gluconate (MAGONATE) 500 MG tablet Take 500 mg by mouth daily.    . polyethylene glycol (MIRALAX / GLYCOLAX) packet Take 17 g by mouth 2 (two) times daily. 14 each 0  . methocarbamol (ROBAXIN) 500 MG tablet Take 1 tablet (500 mg total) by mouth every 6 (six) hours as needed for muscle spasms. (Patient not taking: Reported on 04/02/2017) 40 tablet 0    No results found for this or any previous  visit (from the past 48 hour(s)). No results found.  ROS  Blood pressure 122/76, pulse 81, temperature 97.8 F (36.6 C), temperature source Oral, resp. rate (!) 22, SpO2 100 %. Physical Exam  Constitutional: He appears well-developed and well-nourished.  HENT:  Mouth/Throat: Oropharynx is clear and moist.  Eyes: Conjunctivae are normal. No scleral icterus.  Neck: No thyromegaly present.  Cardiovascular: Normal rate, regular rhythm and normal heart sounds.  Respiratory: Effort normal and breath sounds normal.  GI: Soft. He exhibits no distension and no mass. There is no tenderness.  Musculoskeletal: He exhibits no edema.  Lymphadenopathy:    He has no cervical  adenopathy.  Neurological: He is alert.  Skin: Skin is warm and dry.     Assessment/Plan Cologuard test positive. Diagnostic colonoscopy.  Hildred Laser, MD 04/10/2017, 12:24 PM

## 2017-04-10 NOTE — Discharge Instructions (Signed)
No aspirin or NSAIDs for 1 week. Resume usual medications as before. High-fiber diet. No driving for 24 hours. Physician will call with biopsy results.      Colonoscopy, Adult, Care After This sheet gives you information about how to care for yourself after your procedure. Your doctor may also give you more specific instructions. If you have problems or questions, call your doctor. Follow these instructions at home: General instructions   For the first 24 hours after the procedure: ? Do not drive or use machinery. ? Do not sign important documents. ? Do not drink alcohol. ? Do your daily activities more slowly than normal. ? Eat foods that are soft and easy to digest. ? Rest often.  Take over-the-counter or prescription medicines only as told by your doctor.  It is up to you to get the results of your procedure. Ask your doctor, or the department performing the procedure, when your results will be ready. To help cramping and bloating:  Try walking around.  Put heat on your belly (abdomen) as told by your doctor. Use a heat source that your doctor recommends, such as a moist heat pack or a heating pad. ? Put a towel between your skin and the heat source. ? Leave the heat on for 20-30 minutes. ? Remove the heat if your skin turns bright red. This is especially important if you cannot feel pain, heat, or cold. You can get burned. Eating and drinking  Drink enough fluid to keep your pee (urine) clear or pale yellow.  Return to your normal diet as told by your doctor. Avoid heavy or fried foods that are hard to digest.  Avoid drinking alcohol for as long as told by your doctor. Contact a doctor if:  You have blood in your poop (stool) 2-3 days after the procedure. Get help right away if:  You have more than a small amount of blood in your poop.  You see large clumps of tissue (blood clots) in your poop.  Your belly is swollen.  You feel sick to your stomach  (nauseous).  You throw up (vomit).  You have a fever.  You have belly pain that gets worse, and medicine does not help your pain.    Diverticulosis Diverticulosis is a condition that develops when small pouches (diverticula) form in the wall of the large intestine (colon). The colon is where water is absorbed and stool is formed. The pouches form when the inside layer of the colon pushes through weak spots in the outer layers of the colon. You may have a few pouches or many of them. What are the causes? The cause of this condition is not known. What increases the risk? The following factors may make you more likely to develop this condition:  Being older than age 39. Your risk for this condition increases with age. Diverticulosis is rare among people younger than age 76. By age 75, many people have it.  Eating a low-fiber diet.  Having frequent constipation.  Being overweight.  Not getting enough exercise.  Smoking.  Taking over-the-counter pain medicines, like aspirin and ibuprofen.  Having a family history of diverticulosis.  What are the signs or symptoms? In most people, there are no symptoms of this condition. If you do have symptoms, they may include:  Bloating.  Cramps in the abdomen.  Constipation or diarrhea.  Pain in the lower left side of the abdomen.  How is this diagnosed? This condition is most often diagnosed during an exam  for other colon problems. Because diverticulosis usually has no symptoms, it often cannot be diagnosed independently. This condition may be diagnosed by:  Using a flexible scope to examine the colon (colonoscopy).  Taking an X-ray of the colon after dye has been put into the colon (barium enema).  Doing a CT scan.  How is this treated? You may not need treatment for this condition if you have never developed an infection related to diverticulosis. If you have had an infection before, treatment may include:  Eating a high-fiber  diet. This may include eating more fruits, vegetables, and grains.  Taking a fiber supplement.  Taking a live bacteria supplement (probiotic).  Taking medicine to relax your colon.  Taking antibiotic medicines.  Follow these instructions at home:  Drink 6-8 glasses of water or more each day to prevent constipation.  Try not to strain when you have a bowel movement.  If you have had an infection before: ? Eat more fiber as directed by your health care provider or your diet and nutrition specialist (dietitian). ? Take a fiber supplement or probiotic, if your health care provider approves.  Take over-the-counter and prescription medicines only as told by your health care provider.  If you were prescribed an antibiotic, take it as told by your health care provider. Do not stop taking the antibiotic even if you start to feel better.  Keep all follow-up visits as told by your health care provider. This is important. Contact a health care provider if:  You have pain in your abdomen.  You have bloating.  You have cramps.  You have not had a bowel movement in 3 days. Get help right away if:  Your pain gets worse.  Your bloating becomes very bad.  You have a fever or chills, and your symptoms suddenly get worse.  You vomit.  You have bowel movements that are bloody or black.  You have bleeding from your rectum. Summary  Diverticulosis is a condition that develops when small pouches (diverticula) form in the wall of the large intestine (colon).  You may have a few pouches or many of them.  This condition is most often diagnosed during an exam for other colon problems.  If you have had an infection related to diverticulosis, treatment may include increasing the fiber in your diet, taking supplements, or taking medicines.     Hemorrhoids Hemorrhoids are swollen veins in and around the rectum or anus. There are two types of hemorrhoids:  Internal hemorrhoids. These  occur in the veins that are just inside the rectum. They may poke through to the outside and become irritated and painful.  External hemorrhoids. These occur in the veins that are outside of the anus and can be felt as a painful swelling or hard lump near the anus.  Most hemorrhoids do not cause serious problems, and they can be managed with home treatments such as diet and lifestyle changes. If home treatments do not help your symptoms, procedures can be done to shrink or remove the hemorrhoids. What are the causes? This condition is caused by increased pressure in the anal area. This pressure may result from various things, including:  Constipation.  Straining to have a bowel movement.  Diarrhea.  Pregnancy.  Obesity.  Sitting for long periods of time.  Heavy lifting or other activity that causes you to strain.  Anal sex.  What are the signs or symptoms? Symptoms of this condition include:  Pain.  Anal itching or irritation.  Rectal  bleeding.  Leakage of stool (feces).  Anal swelling.  One or more lumps around the anus.  How is this diagnosed? This condition can often be diagnosed through a visual exam. Other exams or tests may also be done, such as:  Examination of the rectal area with a gloved hand (digital rectal exam).  Examination of the anal canal using a small tube (anoscope).  A blood test, if you have lost a significant amount of blood.  A test to look inside the colon (sigmoidoscopy or colonoscopy).  How is this treated? This condition can usually be treated at home. However, various procedures may be done if dietary changes, lifestyle changes, and other home treatments do not help your symptoms. These procedures can help make the hemorrhoids smaller or remove them completely. Some of these procedures involve surgery, and others do not. Common procedures include:  Rubber band ligation. Rubber bands are placed at the base of the hemorrhoids to cut off  the blood supply to them.  Sclerotherapy. Medicine is injected into the hemorrhoids to shrink them.  Infrared coagulation. A type of light energy is used to get rid of the hemorrhoids.  Hemorrhoidectomy surgery. The hemorrhoids are surgically removed, and the veins that supply them are tied off.  Stapled hemorrhoidopexy surgery. A circular stapling device is used to remove the hemorrhoids and use staples to cut off the blood supply to them.  Follow these instructions at home: Eating and drinking  Eat foods that have a lot of fiber in them, such as whole grains, beans, nuts, fruits, and vegetables. Ask your health care provider about taking products that have added fiber (fiber supplements).  Drink enough fluid to keep your urine clear or pale yellow. Managing pain and swelling  Take warm sitz baths for 20 minutes, 3-4 times a day to ease pain and discomfort.  If directed, apply ice to the affected area. Using ice packs between sitz baths may be helpful. ? Put ice in a plastic bag. ? Place a towel between your skin and the bag. ? Leave the ice on for 20 minutes, 2-3 times a day. General instructions  Take over-the-counter and prescription medicines only as told by your health care provider.  Use medicated creams or suppositories as told.  Exercise regularly.  Go to the bathroom when you have the urge to have a bowel movement. Do not wait.  Avoid straining to have bowel movements.  Keep the anal area dry and clean. Use wet toilet paper or moist towelettes after a bowel movement.  Do not sit on the toilet for long periods of time. This increases blood pooling and pain. Contact a health care provider if:  You have increasing pain and swelling that are not controlled by treatment or medicine.  You have uncontrolled bleeding.  You have difficulty having a bowel movement, or you are unable to have a bowel movement.  You have pain or inflammation outside the area of the  hemorrhoids.    High-Fiber Diet Fiber, also called dietary fiber, is a type of carbohydrate found in fruits, vegetables, whole grains, and beans. A high-fiber diet can have many health benefits. Your health care provider may recommend a high-fiber diet to help:  Prevent constipation. Fiber can make your bowel movements more regular.  Lower your cholesterol.  Relieve hemorrhoids, uncomplicated diverticulosis, or irritable bowel syndrome.  Prevent overeating as part of a weight-loss plan.  Prevent heart disease, type 2 diabetes, and certain cancers.  What is my plan? The recommended  daily intake of fiber includes:  38 grams for men under age 48.  47 grams for men over age 39.  44 grams for women under age 77.  76 grams for women over age 71.  You can get the recommended daily intake of dietary fiber by eating a variety of fruits, vegetables, grains, and beans. Your health care provider may also recommend a fiber supplement if it is not possible to get enough fiber through your diet. What do I need to know about a high-fiber diet?  Fiber supplements have not been widely studied for their effectiveness, so it is better to get fiber through food sources.  Always check the fiber content on thenutrition facts label of any prepackaged food. Look for foods that contain at least 5 grams of fiber per serving.  Ask your dietitian if you have questions about specific foods that are related to your condition, especially if those foods are not listed in the following section.  Increase your daily fiber consumption gradually. Increasing your intake of dietary fiber too quickly may cause bloating, cramping, or gas.  Drink plenty of water. Water helps you to digest fiber. What foods can I eat? Grains Whole-grain breads. Multigrain cereal. Oats and oatmeal. Brown rice. Barley. Bulgur wheat. Hubbard. Bran muffins. Popcorn. Rye wafer crackers. Vegetables Sweet potatoes. Spinach. Kale.  Artichokes. Cabbage. Broccoli. Green peas. Carrots. Squash. Fruits Berries. Pears. Apples. Oranges. Avocados. Prunes and raisins. Dried figs. Meats and Other Protein Sources Navy, kidney, pinto, and soy beans. Split peas. Lentils. Nuts and seeds. Dairy Fiber-fortified yogurt. Beverages Fiber-fortified soy milk. Fiber-fortified orange juice. Other Fiber bars. The items listed above may not be a complete list of recommended foods or beverages. Contact your dietitian for more options. What foods are not recommended? Grains White bread. Pasta made with refined flour. White rice. Vegetables Fried potatoes. Canned vegetables. Well-cooked vegetables. Fruits Fruit juice. Cooked, strained fruit. Meats and Other Protein Sources Fatty cuts of meat. Fried Sales executive or fried fish. Dairy Milk. Yogurt. Cream cheese. Sour cream. Beverages Soft drinks. Other Cakes and pastries. Butter and oils. The items listed above may not be a complete list of foods and beverages to avoid. Contact your dietitian for more information. What are some tips for including high-fiber foods in my diet?  Eat a wide variety of high-fiber foods.  Make sure that half of all grains consumed each day are whole grains.  Replace breads and cereals made from refined flour or white flour with whole-grain breads and cereals.  Replace white rice with brown rice, bulgur wheat, or millet.  Start the day with a breakfast that is high in fiber, such as a cereal that contains at least 5 grams of fiber per serving.  Use beans in place of meat in soups, salads, or pasta.  Eat high-fiber snacks, such as berries, raw vegetables, nuts, or popcorn.    Colon Polyps Polyps are tissue growths inside the body. Polyps can grow in many places, including the large intestine (colon). A polyp may be a round bump or a mushroom-shaped growth. You could have one polyp or several. Most colon polyps are noncancerous (benign). However, some colon  polyps can become cancerous over time. What are the causes? The exact cause of colon polyps is not known. What increases the risk? This condition is more likely to develop in people who:  Have a family history of colon cancer or colon polyps.  Are older than 71 or older than 45 if they are African American.  Have inflammatory bowel disease, such as ulcerative colitis or Crohn disease.  Are overweight.  Smoke cigarettes.  Do not get enough exercise.  Drink too much alcohol.  Eat a diet that is: ? High in fat and red meat. ? Low in fiber.  Had childhood cancer that was treated with abdominal radiation.  What are the signs or symptoms? Most polyps do not cause symptoms. If you have symptoms, they may include:  Blood coming from your rectum when having a bowel movement.  Blood in your stool.The stool may look dark red or black.  A change in bowel habits, such as constipation or diarrhea.  How is this diagnosed? This condition is diagnosed with a colonoscopy. This is a procedure that uses a lighted, flexible scope to look at the inside of your colon. How is this treated? Treatment for this condition involves removing any polyps that are found. Those polyps will then be tested for cancer. If cancer is found, your health care provider will talk to you about options for colon cancer treatment. Follow these instructions at home: Diet  Eat plenty of fiber, such as fruits, vegetables, and whole grains.  Eat foods that are high in calcium and vitamin D, such as milk, cheese, yogurt, eggs, liver, fish, and broccoli.  Limit foods high in fat, red meats, and processed meats, such as hot dogs, sausage, bacon, and lunch meats.  Maintain a healthy weight, or lose weight if recommended by your health care provider. General instructions  Do not smoke cigarettes.  Do not drink alcohol excessively.  Keep all follow-up visits as told by your health care provider. This is important.  This includes keeping regularly scheduled colonoscopies. Talk to your health care provider about when you need a colonoscopy.  Exercise every day or as told by your health care provider. Contact a health care provider if:  You have new or worsening bleeding during a bowel movement.  You have new or increased blood in your stool.  You have a change in bowel habits.  You unexpectedly lose weight. This information is not intended to replace advice given to you by your health care provider. Make sure you discuss any questions you have with your health care provider. Document Released: 12/06/2003 Document Revised: 08/17/2015 Document Reviewed: 01/30/2015 Elsevier Interactive Patient Education  [employer].

## 2017-04-14 ENCOUNTER — Encounter (HOSPITAL_COMMUNITY): Payer: Self-pay | Admitting: Internal Medicine

## 2017-05-08 IMAGING — DX DG HIP (WITH OR WITHOUT PELVIS) 1V PORT*L*
2 series · 2 of 2 positions shown · non-contrast
Comparison: None in PACs

CLINICAL DATA: Postop left total hip joint prosthesis placement.

EXAM:
DG HIP (WITH OR WITHOUT PELVIS) 1V PORT LEFT

[pelvis ap]
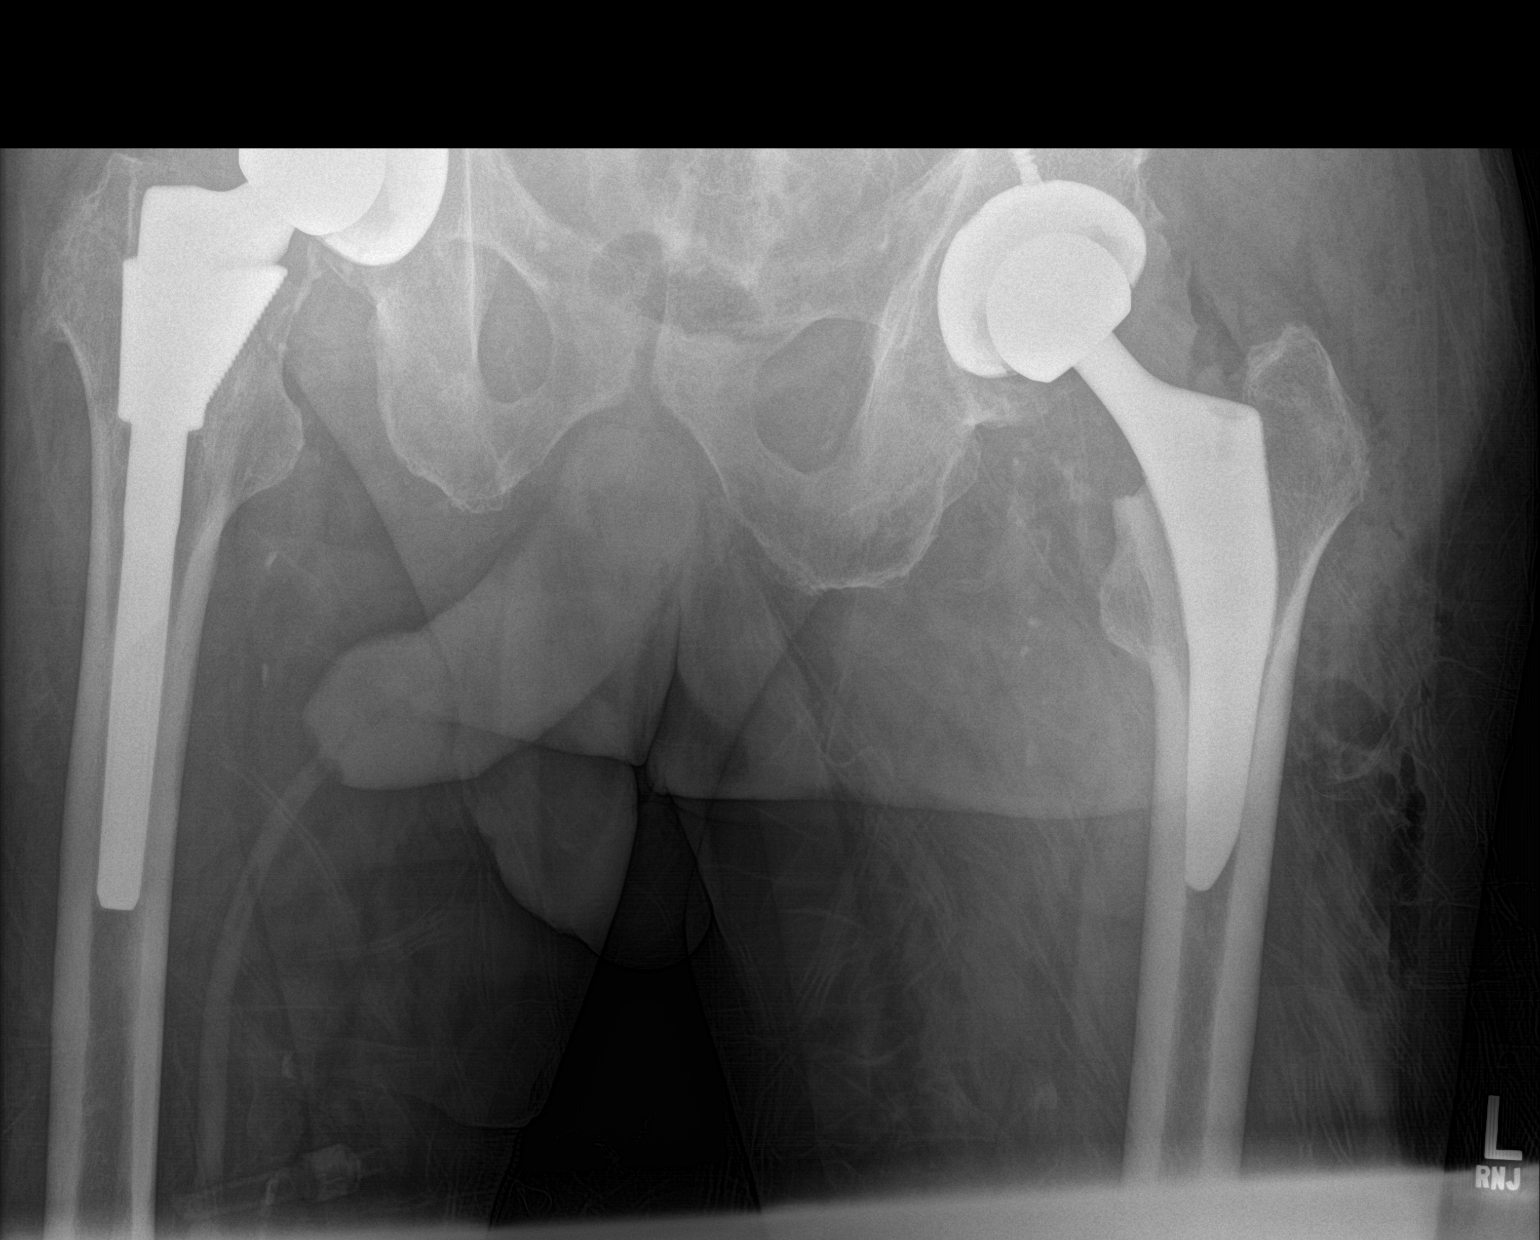

[hip lat]
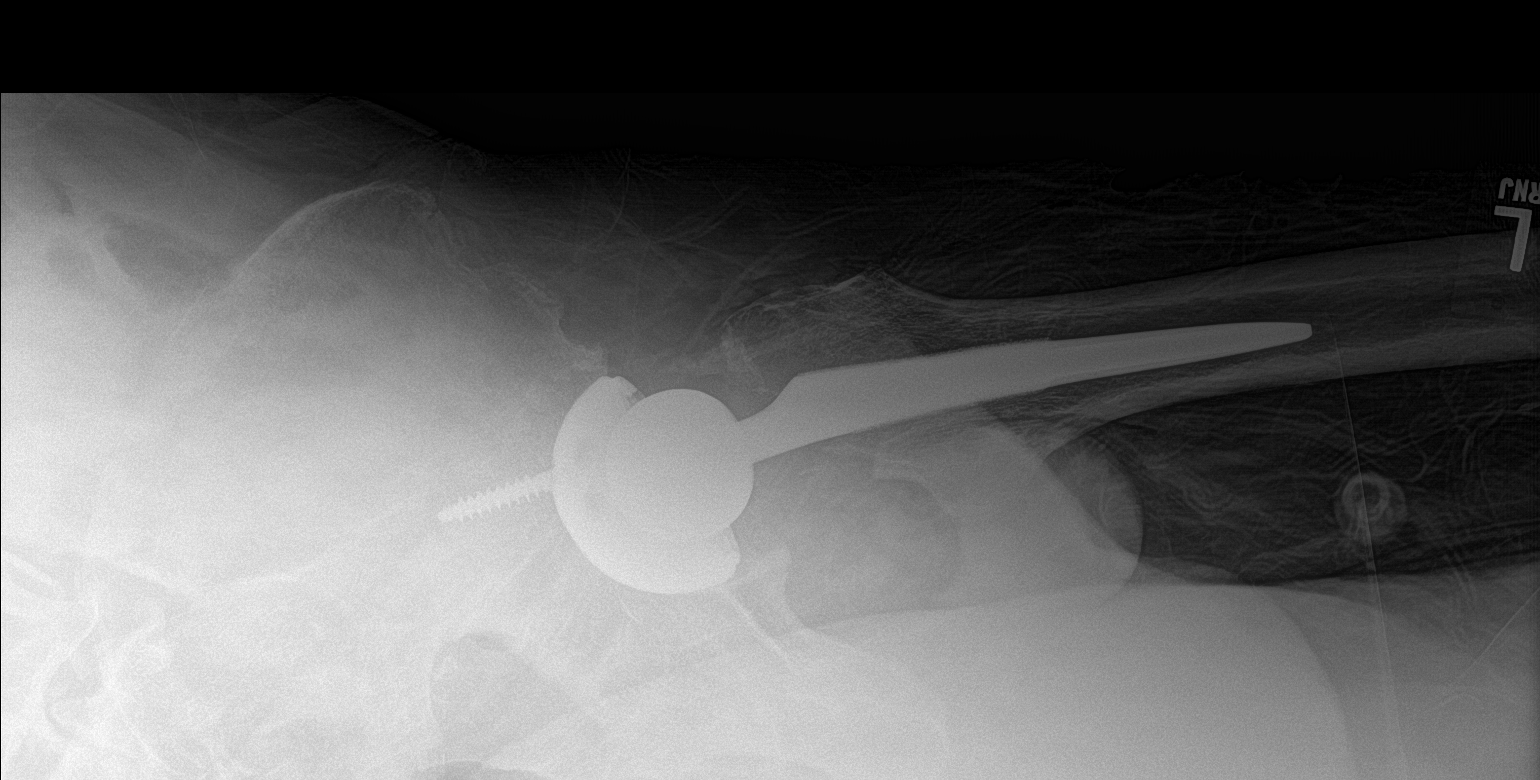

[2 of 2 positions shown; findings below may reference images not displayed]

FINDINGS: Portable AP and cross-table lateral views of the left hip reveal the
presence of a total joint prosthesis. Radiographic positioning of
the prosthetic components is good. The interface with the native
bone appears normal.
IMPRESSION: No immediate postprocedure complication following left total hip
joint prosthesis placement.

## 2017-08-26 ENCOUNTER — Ambulatory Visit (HOSPITAL_COMMUNITY): Payer: Medicare Other | Admitting: Hematology

## 2018-04-06 ENCOUNTER — Encounter (INDEPENDENT_AMBULATORY_CARE_PROVIDER_SITE_OTHER): Payer: Self-pay | Admitting: Internal Medicine

## 2018-04-06 ENCOUNTER — Ambulatory Visit (INDEPENDENT_AMBULATORY_CARE_PROVIDER_SITE_OTHER): Payer: Medicare Other | Admitting: Internal Medicine

## 2018-04-06 ENCOUNTER — Ambulatory Visit (HOSPITAL_COMMUNITY)
Admission: RE | Admit: 2018-04-06 | Discharge: 2018-04-06 | Disposition: A | Discharge status: Home / Self Care | Payer: Medicare Other | Source: Ambulatory Visit | Attending: Internal Medicine | Admitting: Internal Medicine

## 2018-04-06 VITALS — BP 143/65 | HR 96 | Temp 97.7°F | Ht 66.0 in | Wt 186.6 lb

## 2018-04-06 DIAGNOSIS — K5900 Constipation, unspecified: Secondary | ICD-10-CM | POA: Diagnosis not present

## 2018-04-06 NOTE — Progress Notes (Signed)
Subjective:    Patient ID: Jon Sexton, male    DOB: 1935-07-06, 83 y.o.   MRN: [id number] PCP Dr. Chauncey Reading.   HPI Referred by Dr. Chauncey Reading for abdominal pain/constipation. He tells me he has been constipated for 2 1/2 weeks. Has been taking Amitiza and Lactulose which has not helped. Says when he goes to the [facility] it is water.  He said he had xrays in [place] and there was no blockage. States he has drank Golytle which did not help.  Underwent a colonoscopy 04/10/2017 which revealed 2 sessile polyps.  One polyp was sessille serrated polyp and other tubular adenoma.  Next colonoscopy in 5 yrs.    Review of Systems Past Medical History:  Diagnosis Date  . Arthritis   . BPH (benign prostatic hyperplasia) 02/25/2017  . Disc disorder    bulging disc  . GERD (gastroesophageal reflux disease)    occasionally  . Gout 02/25/2017  . Hyperlipemia   . Hypertension   . Positive colorectal cancer screening using Cologuard test 02/25/2017    Past Surgical History:  Procedure Laterality Date  . COLONOSCOPY    . COLONOSCOPY  03/04/2012   Procedure: COLONOSCOPY;  Surgeon: Rogene Houston, MD;  Location: [facility];  Service: Endoscopy;  Laterality: N/A;  1200  . COLONOSCOPY N/A 04/10/2017   Procedure: COLONOSCOPY;  Surgeon: Rogene Houston, MD;  Location: [facility];  Service: Endoscopy;  Laterality: N/A;  2:00-moved to 12:15 per Lelon Frohlich  . EYE SURGERY     bilateral cataract  . left hip replacement    . POLYPECTOMY  04/10/2017   Procedure: POLYPECTOMY;  Surgeon: Rogene Houston, MD;  Location: [facility];  Service: Endoscopy;;  cecal, ascending, sigmoid  . rt hip replacement    . TONSILLECTOMY    . TOTAL HIP ARTHROPLASTY Left 07/02/2016   Procedure: LEFT TOTAL HIP ARTHROPLASTY ANTERIOR APPROACH;  Surgeon: Paralee Cancel, MD;  Location: [facility];  Service: Orthopedics;  Laterality: Left;    No Known Allergies  Current Outpatient Medications on File Prior to Visit  Medication Sig Dispense  Refill  . allopurinol (ZYLOPRIM) 100 MG tablet Take 1 tablet by mouth daily.    . Coenzyme Q10 (COQ10) 100 MG CAPS Take 1 capsule by mouth daily.    . finasteride (PROSCAR) 5 MG tablet Take 5 mg by mouth daily.    Marland Kitchen L-Arginine POWD Take 1 scoop by mouth daily.    Marland Kitchen lactulose (CEPHULAC) 10 g packet Take 10 g by mouth 2 (two) times daily.    Marland Kitchen levothyroxine (SYNTHROID, LEVOTHROID) 50 MCG tablet Take 50 mcg by mouth daily before breakfast.    . lisinopril (PRINIVIL,ZESTRIL) 40 MG tablet Take 40 mg by mouth daily.    Marland Kitchen lisinopril (PRINIVIL,ZESTRIL) 40 MG tablet Take 40 mg by mouth daily.    Marland Kitchen lubiprostone (AMITIZA) 24 MCG capsule Take 24 mcg by mouth daily with breakfast.    . lubiprostone (AMITIZA) 24 MCG capsule Take 24 mcg by mouth 2 (two) times daily with a meal.    . tamsulosin (FLOMAX) 0.4 MG CAPS capsule Take 0.4 mg by mouth.    . traZODone (DESYREL) 100 MG tablet Take 100 mg by mouth at bedtime.    . Wheat Dextrin (BENEFIBER PO) Take by mouth 2 (two) times daily after a meal.    . polyethylene glycol (MIRALAX / GLYCOLAX) packet Take 17 g by mouth 2 (two) times daily. 14 each 0   No current facility-administered medications on  file prior to visit.         Objective:   Physical Exam Blood pressure (!) 143/65, pulse 96, temperature 97.7 F (36.5 C), height 5\' 6"  (1.676 m), weight 186 lb 9.6 oz (84.6 kg).  Alert and oriented. Skin warm and dry. Oral mucosa is moist.   . Sclera anicteric, conjunctivae is pink. Thyroid not enlarged. No cervical lymphadenopathy. Lungs clear. Heart regular rate and rhythm.  Abdomen is soft. Bowel sounds are positive. No hepatomegaly. No abdominal masses felt. No tenderness.  No edema to lower extremities.  Rectal exam: No masses felt. No impaction.         Assessment & Plan:  Constipation: KUB to see if he is constipated  Further recommendations to follow.

## 2019-02-10 IMAGING — DX DG ABDOMEN 1V
2 series · 2 of 2 positions shown · non-contrast
Comparison: None.

CLINICAL DATA: Constipation for 2 weeks. Watery stools and weight
loss.

EXAM:
ABDOMEN - 1 VIEW

[abdomen kub (1 of 2)]
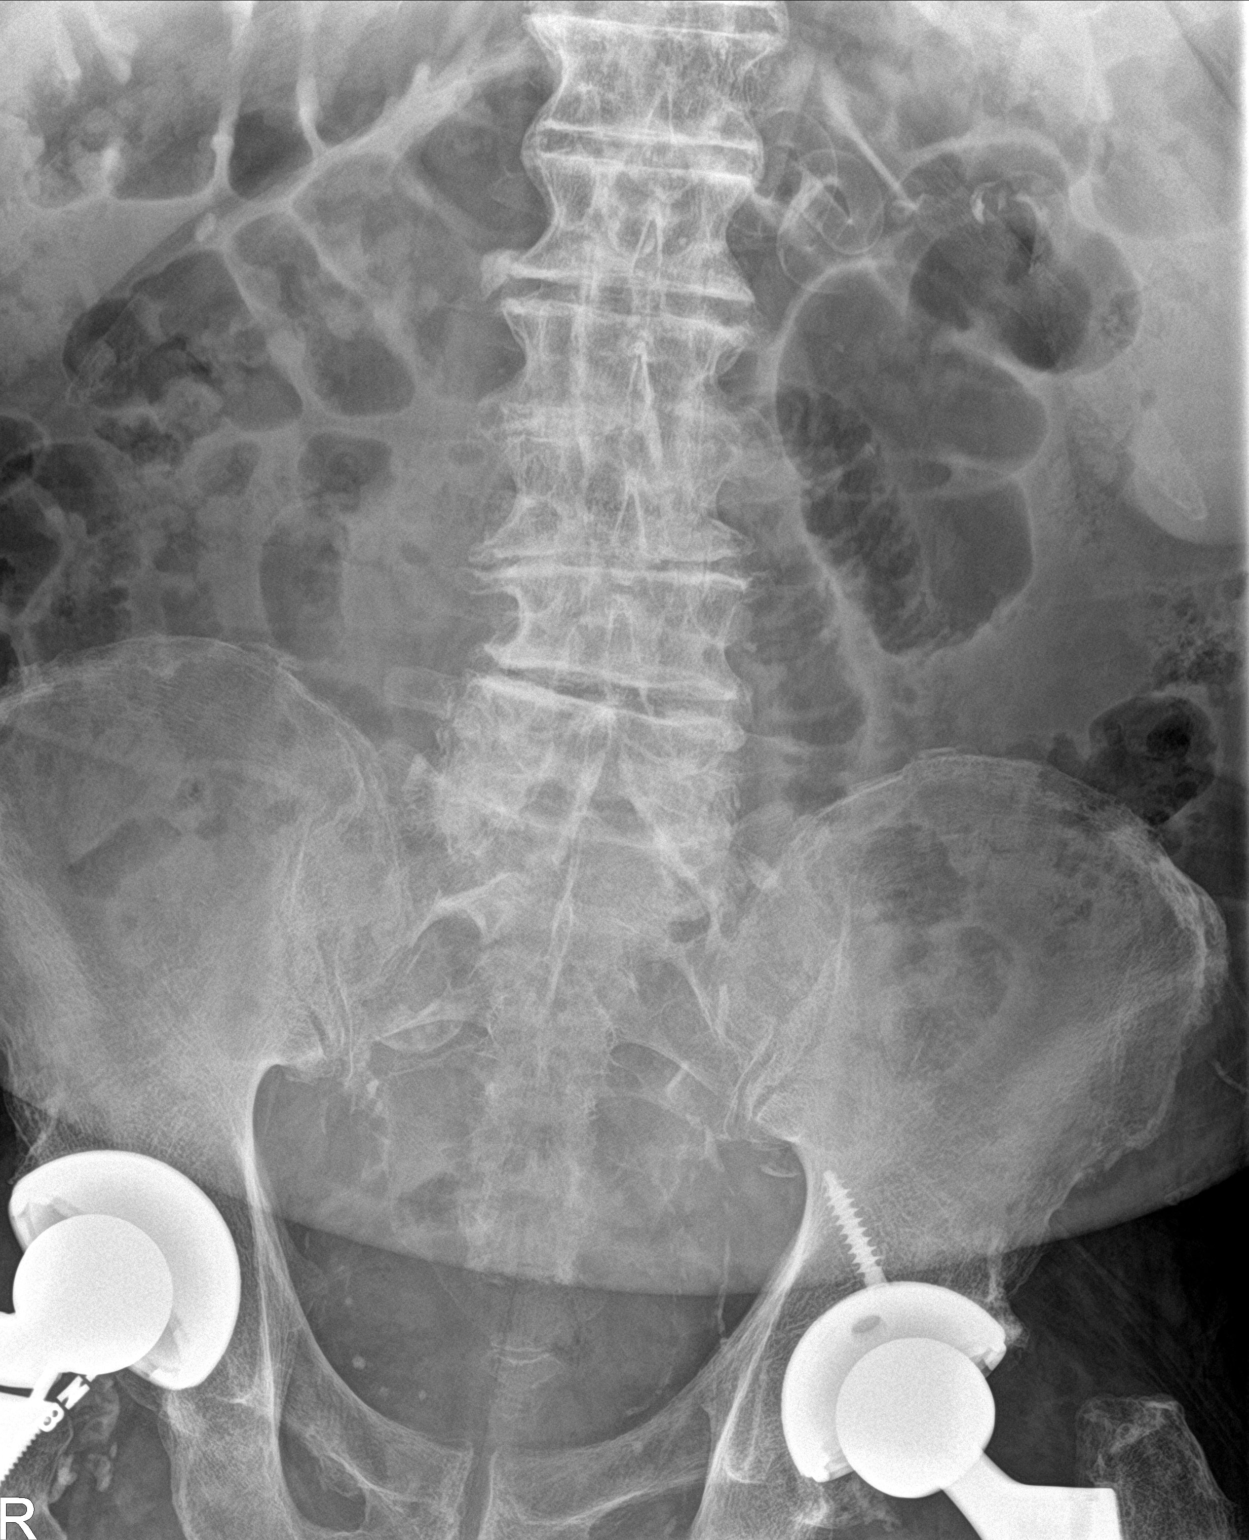

[abdomen kub (2 of 2)]
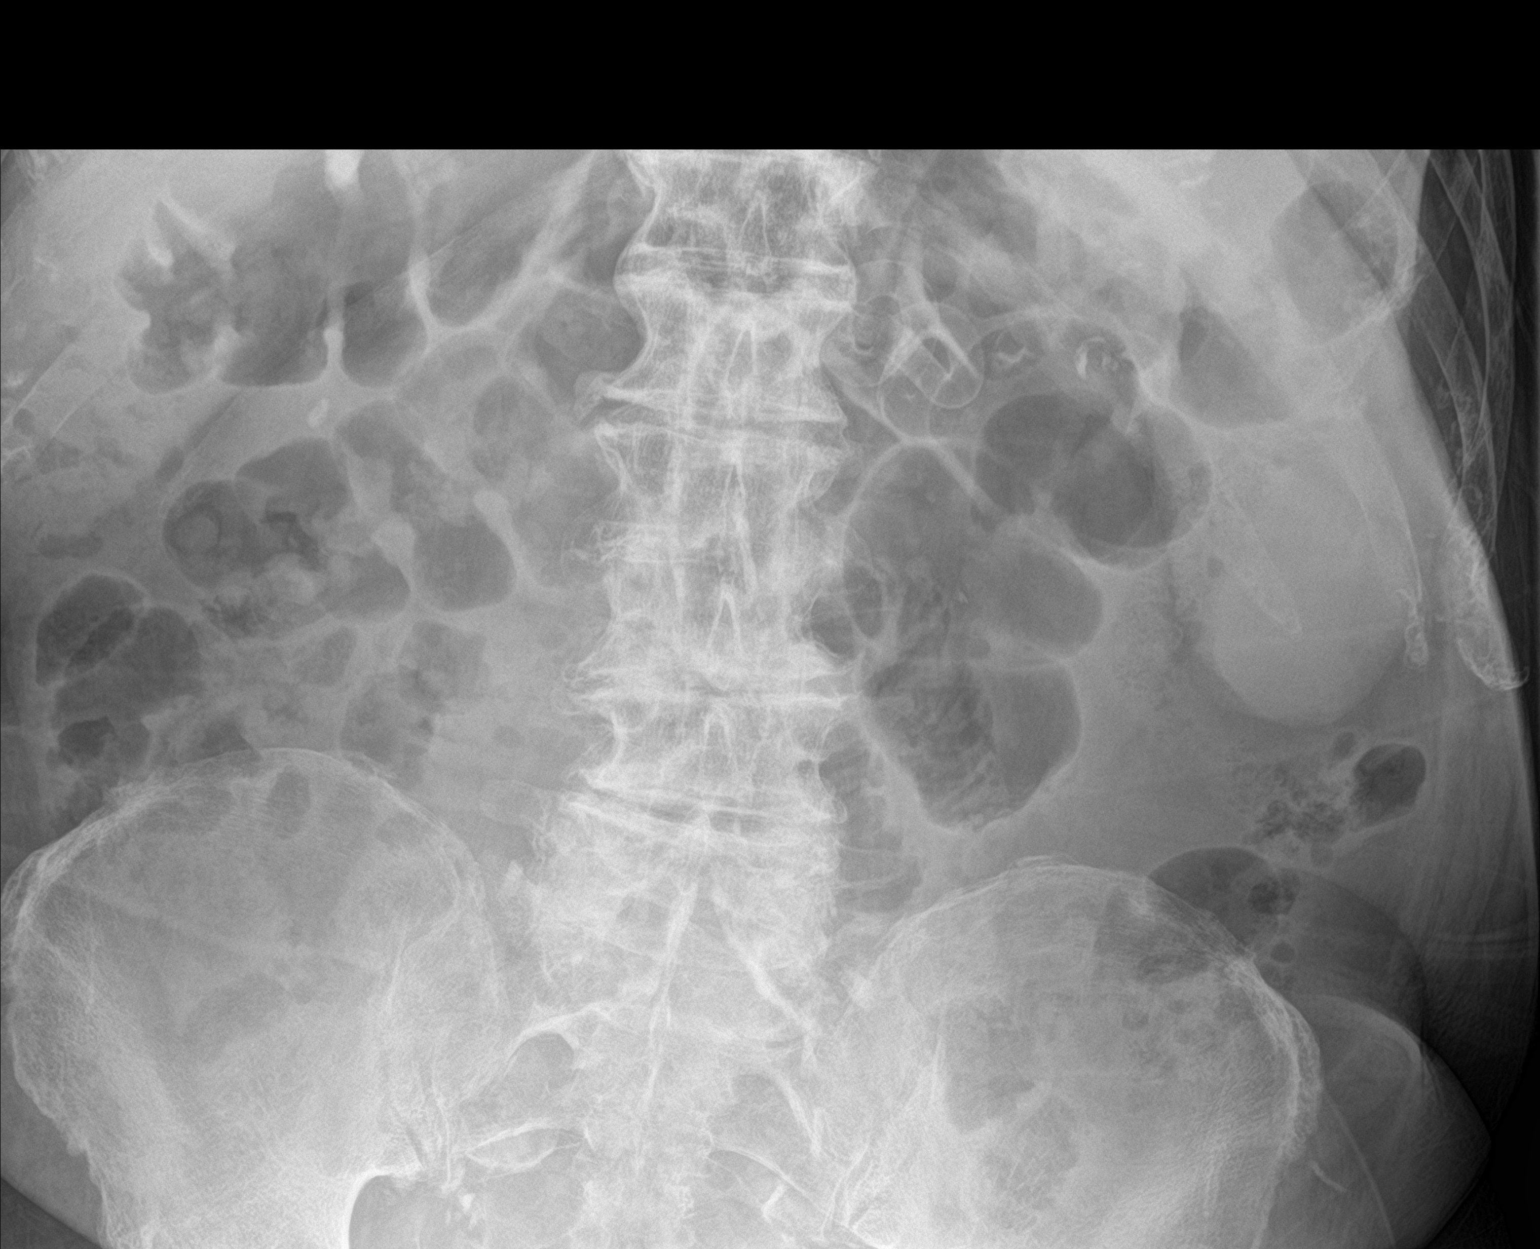

[2 of 2 positions shown; findings below may reference images not displayed]

FINDINGS: The bowel gas pattern is nonobstructive. Average amount of stool
burden. Vascular calcifications are noted.

No radiographic evidence of organomegaly.

Bilateral hip prosthesis are noted.
IMPRESSION: Negative.

## 2020-01-24 DEATH — deceased

## 2022-04-08 ENCOUNTER — Encounter (INDEPENDENT_AMBULATORY_CARE_PROVIDER_SITE_OTHER): Payer: Self-pay | Admitting: *Deleted
